# Patient Record
Sex: Male | Born: 1965
Health system: Southern US, Community
[De-identification: ages and names within clinical notes are randomized; demographics above are authoritative.]

## PROBLEM LIST (undated history)

## (undated) DIAGNOSIS — E785 Hyperlipidemia, unspecified: Secondary | ICD-10-CM

## (undated) DIAGNOSIS — E119 Type 2 diabetes mellitus without complications: Secondary | ICD-10-CM

## (undated) DIAGNOSIS — M3310 Other dermatopolymyositis, organ involvement unspecified: Secondary | ICD-10-CM

## (undated) DIAGNOSIS — I2510 Atherosclerotic heart disease of native coronary artery without angina pectoris: Secondary | ICD-10-CM

## (undated) DIAGNOSIS — T7840XA Allergy, unspecified, initial encounter: Secondary | ICD-10-CM

## (undated) HISTORY — PX: RHINOPLASTY: SUR1284

## (undated) HISTORY — DX: Allergy, unspecified, initial encounter: T78.40XA

## (undated) HISTORY — PX: KNEE ARTHROSCOPY: SUR90

## (undated) HISTORY — DX: Type 2 diabetes mellitus without complications: E11.9

## (undated) HISTORY — DX: Hyperlipidemia, unspecified: E78.5

---

## 2002-11-10 HISTORY — PX: LUMBAR DISC SURGERY: SHX700

## 2007-07-11 ENCOUNTER — Encounter: Admission: RE | Admit: 2007-07-11 | Discharge: 2007-07-11 | Payer: Self-pay | Admitting: Unknown Physician Specialty

## 2008-03-22 ENCOUNTER — Emergency Department: Payer: Self-pay | Admitting: Emergency Medicine

## 2010-02-06 ENCOUNTER — Encounter: Admission: RE | Admit: 2010-02-06 | Discharge: 2010-02-06 | Payer: Self-pay | Admitting: Internal Medicine

## 2013-07-15 DIAGNOSIS — M3310 Other dermatopolymyositis, organ involvement unspecified: Secondary | ICD-10-CM | POA: Insufficient documentation

## 2015-02-10 DIAGNOSIS — E668 Other obesity: Secondary | ICD-10-CM | POA: Insufficient documentation

## 2015-02-10 DIAGNOSIS — E1169 Type 2 diabetes mellitus with other specified complication: Secondary | ICD-10-CM | POA: Insufficient documentation

## 2015-02-10 DIAGNOSIS — M5137 Other intervertebral disc degeneration, lumbosacral region: Secondary | ICD-10-CM | POA: Insufficient documentation

## 2015-02-10 DIAGNOSIS — E781 Pure hyperglyceridemia: Secondary | ICD-10-CM | POA: Insufficient documentation

## 2015-02-10 DIAGNOSIS — Z1331 Encounter for screening for depression: Secondary | ICD-10-CM | POA: Insufficient documentation

## 2015-02-10 DIAGNOSIS — L240 Irritant contact dermatitis due to detergents: Secondary | ICD-10-CM | POA: Insufficient documentation

## 2015-02-10 DIAGNOSIS — R030 Elevated blood-pressure reading, without diagnosis of hypertension: Secondary | ICD-10-CM | POA: Insufficient documentation

## 2015-02-10 DIAGNOSIS — R079 Chest pain, unspecified: Secondary | ICD-10-CM | POA: Insufficient documentation

## 2015-02-10 DIAGNOSIS — Z713 Dietary counseling and surveillance: Secondary | ICD-10-CM | POA: Insufficient documentation

## 2015-02-10 DIAGNOSIS — E119 Type 2 diabetes mellitus without complications: Secondary | ICD-10-CM | POA: Insufficient documentation

## 2015-02-10 DIAGNOSIS — Z23 Encounter for immunization: Secondary | ICD-10-CM | POA: Insufficient documentation

## 2015-02-10 DIAGNOSIS — J069 Acute upper respiratory infection, unspecified: Secondary | ICD-10-CM

## 2015-02-10 DIAGNOSIS — K2100 Gastro-esophageal reflux disease with esophagitis, without bleeding: Secondary | ICD-10-CM | POA: Insufficient documentation

## 2015-02-10 DIAGNOSIS — J309 Allergic rhinitis, unspecified: Secondary | ICD-10-CM | POA: Insufficient documentation

## 2015-02-10 DIAGNOSIS — B9689 Other specified bacterial agents as the cause of diseases classified elsewhere: Secondary | ICD-10-CM | POA: Insufficient documentation

## 2015-02-10 DIAGNOSIS — Z8739 Personal history of other diseases of the musculoskeletal system and connective tissue: Secondary | ICD-10-CM | POA: Insufficient documentation

## 2015-02-10 DIAGNOSIS — E785 Hyperlipidemia, unspecified: Secondary | ICD-10-CM | POA: Insufficient documentation

## 2015-02-10 DIAGNOSIS — E669 Obesity, unspecified: Secondary | ICD-10-CM

## 2015-02-10 DIAGNOSIS — E782 Mixed hyperlipidemia: Secondary | ICD-10-CM | POA: Insufficient documentation

## 2015-04-18 ENCOUNTER — Encounter (INDEPENDENT_AMBULATORY_CARE_PROVIDER_SITE_OTHER): Payer: Self-pay

## 2015-04-18 ENCOUNTER — Ambulatory Visit (INDEPENDENT_AMBULATORY_CARE_PROVIDER_SITE_OTHER): Payer: 59 | Admitting: Family Medicine

## 2015-04-18 ENCOUNTER — Encounter: Payer: Self-pay | Admitting: Family Medicine

## 2015-04-18 VITALS — BP 132/78 | HR 87 | Temp 98.5°F | Resp 16 | Ht 71.0 in | Wt 308.0 lb

## 2015-04-18 DIAGNOSIS — E669 Obesity, unspecified: Secondary | ICD-10-CM | POA: Diagnosis not present

## 2015-04-18 DIAGNOSIS — E119 Type 2 diabetes mellitus without complications: Secondary | ICD-10-CM

## 2015-04-18 DIAGNOSIS — E785 Hyperlipidemia, unspecified: Secondary | ICD-10-CM

## 2015-04-18 DIAGNOSIS — R51 Headache: Secondary | ICD-10-CM

## 2015-04-18 DIAGNOSIS — R519 Headache, unspecified: Secondary | ICD-10-CM | POA: Insufficient documentation

## 2015-04-18 DIAGNOSIS — E1169 Type 2 diabetes mellitus with other specified complication: Secondary | ICD-10-CM

## 2015-04-18 MED ORDER — ROSUVASTATIN CALCIUM 20 MG PO TABS: 20.0000 mg | ORAL_TABLET | Freq: Once | ORAL | Status: AC

## 2015-04-18 MED ORDER — FENOFIBRATE 160 MG PO TABS: 160.0000 mg | ORAL_TABLET | Freq: Once | ORAL | Status: AC

## 2015-04-18 MED ORDER — GLIPIZIDE 5 MG PO TABS: 5.0000 mg | ORAL_TABLET | Freq: Two times a day (BID) | ORAL | Status: AC

## 2015-04-18 NOTE — Progress Notes (Signed)
Name: Jon Fowler   MRN: [id number]    DOB: 1965-11-18   Date:04/18/2015       Progress Note  Subjective  Chief Complaint  Chief Complaint  Patient presents with  . Follow-up    3 month for high cholesterol  . Headache    Onset 1 year    Headache  This is a recurrent (recently noticed that after IVIG treatments, he would have headaches) problem. The pain is located in the left unilateral and retro-orbital region. The pain does not radiate. The pain quality is similar to prior headaches. The quality of the pain is described as sharp. The pain is at a severity of 4/10. Associated symptoms include dizziness (occasional dizziness). Pertinent negatives include no blurred vision, ear pain, sore throat, visual change, weakness or weight loss. He has tried NSAIDs for the symptoms. The treatment provided significant relief. His past medical history is significant for obesity and sinus disease. There is no history of cluster headaches, hypertension or migraine headaches.  Diabetes He has type 2 diabetes mellitus. Hypoglycemia symptoms include dizziness (occasional dizziness) and headaches. Pertinent negatives for diabetes include no blurred vision, no chest pain, no fatigue, no polydipsia, no polyuria, no visual change, no weakness and no weight loss. Symptoms are improving. Pertinent negatives for diabetic complications include no CVA, heart disease, nephropathy or peripheral neuropathy. Risk factors for coronary artery disease include male sex, obesity and dyslipidemia. Current diabetic treatment includes oral agent (monotherapy). His weight is stable. He is following a diabetic and generally healthy diet. Frequency home blood tests: Pt. is not checking his Blood Glucose. An ACE inhibitor/angiotensin II receptor blocker is not being taken. He does not see a podiatrist.Eye exam is current.  Hyperlipidemia This is a chronic problem. The problem is controlled. Recent lipid tests were reviewed and are normal.  Exacerbating diseases include diabetes and obesity. He has no history of hypothyroidism. Pertinent negatives include no chest pain, focal sensory loss, leg pain, myalgias or shortness of breath. Current antihyperlipidemic treatment includes statins and fibric acid derivatives. The current treatment provides significant improvement of lipids. There are no compliance problems.  Risk factors for coronary artery disease include dyslipidemia, male sex and obesity.      Past Medical History  Diagnosis Date  . Hyperlipidemia   . Hypertension   . Diabetes mellitus without complication   . Allergy     History  Substance Use Topics  . Smoking status: Never Smoker   . Smokeless tobacco: Not on file  . Alcohol Use: No     Current outpatient prescriptions:  .  ALPRAZolam (XANAX) 0.5 MG tablet, Take 0.5 mg by mouth daily as needed., Disp: , Rfl:  .  aspirin EC 81 MG tablet, Take 81 mg by mouth daily., Disp: , Rfl:  .  Cholecalciferol (VITAMIN D) 2000 UNITS CAPS, Take 2,000 Units by mouth daily., Disp: , Rfl:  .  clobetasol ointment (TEMOVATE) 0.05 %, Apply 0.73 application topically 2 (two) times daily., Disp: , Rfl:  .  dapsone 100 MG tablet, Take 100 mg by mouth daily., Disp: , Rfl:  .  fenofibrate 160 MG tablet, Take 160 mg by mouth once., Disp: , Rfl:  .  glipiZIDE (GLUCOTROL) 5 MG tablet, Take 5 mg by mouth 2 (two) times daily., Disp: , Rfl:  .  hydroxychloroquine (PLAQUENIL) 200 MG tablet, Take 200 mg by mouth daily., Disp: , Rfl:  .  mycophenolate (CELLCEPT) 500 MG tablet, Take 500 mg by mouth 2 (two) times daily.  3 tablets, Disp: , Rfl:  .  rosuvastatin (CRESTOR) 20 MG tablet, Take 20 mg by mouth once. At Bedtime, Disp: , Rfl:   Allergies  Allergen Reactions  . Metformin     heartburn    Review of Systems  Constitutional: Negative for weight loss and fatigue.  HENT: Negative for congestion, ear pain and sore throat.   Eyes: Negative for blurred vision and double vision.   Respiratory: Negative for shortness of breath.   Cardiovascular: Negative for chest pain.  Musculoskeletal: Negative for myalgias.  Neurological: Positive for dizziness (occasional dizziness) and headaches. Negative for weakness.  Endo/Heme/Allergies: Negative for polydipsia.      Objective  Filed Vitals:   04/18/15 0828  BP: 132/78  Pulse: 87  Temp: 98.5 F (36.9 C)  TempSrc: Oral  Resp: 16  Height: 5\' 11"  (1.803 m)  Weight: 308 lb (139.708 kg)  SpO2: 93%     Physical Exam  Constitutional: He is well-developed, well-nourished, and in no distress.  HENT:  Head: Normocephalic and atraumatic.  Right Ear: Hearing and ear canal normal.  Left Ear: Hearing and ear canal normal.  Nose: Right sinus exhibits no maxillary sinus tenderness and no frontal sinus tenderness. Left sinus exhibits no maxillary sinus tenderness and no frontal sinus tenderness.  Eyes: Conjunctivae are normal. Pupils are equal, round, and reactive to light.  Neck: Normal range of motion. Neck supple.  Cardiovascular: Normal rate and regular rhythm.   Pulmonary/Chest: Effort normal and breath sounds normal.    No results found for this or any previous visit (from the past 2160 hour(s)).   Assessment & Plan 1. Dyslipidemia Lipid panel is at goal on fenofibrate and Crestor. We will repeat today. - fenofibrate 160 MG tablet; Take 1 tablet (160 mg total) by mouth once.  Dispense: 90 tablet; Refill: 1 - rosuvastatin (CRESTOR) 20 MG tablet; Take 1 tablet (20 mg total) by mouth once. At Bedtime  Dispense: 90 tablet; Refill: 2 - Lipid Profile  2. Diabetes mellitus type 2 in obe - glipiZIDE (GLUCOTROL) 5 MG tablet; Take 1 tablet (5 mg total) by mouth 2 (two) times daily before a meal.  Dispense: 180 tablet; Refill: 0 - HgB A1c  3. Headache above the eye region  Likely from the IVIG infusion. OTC NSAIDs usually relieve the headache. Patient was reassured    Toua Stites [facility] 04/18/2015 8:48 AM

## 2015-04-19 LAB — LIPID PANEL
CHOL/HDL RATIO: 2.8 ratio (ref 0.0–5.0)
Cholesterol, Total: 131 mg/dL (ref 100–199)
HDL: 46 mg/dL (ref >39)
LDL CALC: 61 mg/dL (ref 0–99)
TRIGLYCERIDES: 122 mg/dL (ref 0–149)
VLDL CHOLESTEROL CAL: 24 mg/dL (ref 5–40)

## 2015-04-19 LAB — HEMOGLOBIN A1C
ESTIMATED AVERAGE GLUCOSE: 97 mg/dL
HEMOGLOBIN A1C: 5 % (ref 4.8–5.6)

## 2015-07-19 ENCOUNTER — Ambulatory Visit: Payer: 59 | Admitting: Family Medicine

## 2015-10-23 ENCOUNTER — Other Ambulatory Visit: Payer: Self-pay | Admitting: Family Medicine

## 2016-04-28 ENCOUNTER — Other Ambulatory Visit: Payer: Self-pay | Admitting: Family Medicine

## 2016-12-05 DIAGNOSIS — M3310 Other dermatopolymyositis, organ involvement unspecified: Secondary | ICD-10-CM | POA: Diagnosis not present

## 2016-12-06 DIAGNOSIS — M3310 Other dermatopolymyositis, organ involvement unspecified: Secondary | ICD-10-CM | POA: Diagnosis not present

## 2017-01-02 DIAGNOSIS — M3310 Other dermatopolymyositis, organ involvement unspecified: Secondary | ICD-10-CM | POA: Diagnosis not present

## 2017-01-03 DIAGNOSIS — M3310 Other dermatopolymyositis, organ involvement unspecified: Secondary | ICD-10-CM | POA: Diagnosis not present

## 2017-01-04 ENCOUNTER — Emergency Department
Admission: EM | Admit: 2017-01-04 | Discharge: 2017-01-05 | Disposition: A | Discharge status: Home / Self Care | Payer: 59 | Attending: Emergency Medicine | Admitting: Emergency Medicine

## 2017-01-04 ENCOUNTER — Encounter: Payer: Self-pay | Admitting: Emergency Medicine

## 2017-01-04 DIAGNOSIS — K529 Noninfective gastroenteritis and colitis, unspecified: Secondary | ICD-10-CM | POA: Diagnosis not present

## 2017-01-04 DIAGNOSIS — A080 Rotaviral enteritis: Secondary | ICD-10-CM | POA: Diagnosis not present

## 2017-01-04 DIAGNOSIS — E119 Type 2 diabetes mellitus without complications: Secondary | ICD-10-CM | POA: Diagnosis not present

## 2017-01-04 DIAGNOSIS — I10 Essential (primary) hypertension: Secondary | ICD-10-CM | POA: Insufficient documentation

## 2017-01-04 DIAGNOSIS — R1084 Generalized abdominal pain: Secondary | ICD-10-CM | POA: Diagnosis not present

## 2017-01-04 DIAGNOSIS — I2510 Atherosclerotic heart disease of native coronary artery without angina pectoris: Secondary | ICD-10-CM | POA: Insufficient documentation

## 2017-01-04 DIAGNOSIS — R112 Nausea with vomiting, unspecified: Secondary | ICD-10-CM | POA: Diagnosis present

## 2017-01-04 DIAGNOSIS — Z7984 Long term (current) use of oral hypoglycemic drugs: Secondary | ICD-10-CM | POA: Insufficient documentation

## 2017-01-04 HISTORY — DX: Atherosclerotic heart disease of native coronary artery without angina pectoris: I25.10

## 2017-01-04 HISTORY — DX: Other dermatomyositis, organ involvement unspecified: M33.10

## 2017-01-04 LAB — LIPASE, BLOOD: LIPASE: 13 U/L (ref 11–51)

## 2017-01-04 LAB — COMPREHENSIVE METABOLIC PANEL
ALBUMIN: 2.8 g/dL — AB (ref 3.5–5.0)
ALT: 22 U/L (ref 17–63)
ANION GAP: 7 (ref 5–15)
AST: 24 U/L (ref 15–41)
Alkaline Phosphatase: 32 U/L — ABNORMAL LOW (ref 38–126)
BILIRUBIN TOTAL: 1 mg/dL (ref 0.3–1.2)
BUN: 20 mg/dL (ref 6–20)
CO2: 20 mmol/L — ABNORMAL LOW (ref 22–32)
Calcium: 7.9 mg/dL — ABNORMAL LOW (ref 8.9–10.3)
Chloride: 107 mmol/L (ref 101–111)
Creatinine, Ser: 1.01 mg/dL (ref 0.61–1.24)
GFR calc Af Amer: >60 mL/min (ref >60)
GFR calc non Af Amer: >60 mL/min (ref >60)
GLUCOSE: 145 mg/dL — AB (ref 65–99)
POTASSIUM: 3.4 mmol/L — AB (ref 3.5–5.1)
SODIUM: 134 mmol/L — AB (ref 135–145)
TOTAL PROTEIN: 8.9 g/dL — AB (ref 6.5–8.1)

## 2017-01-04 LAB — URINALYSIS, ROUTINE W REFLEX MICROSCOPIC
BILIRUBIN URINE: NEGATIVE
Glucose, UA: NEGATIVE mg/dL
KETONES UR: NEGATIVE mg/dL
LEUKOCYTES UA: NEGATIVE
Nitrite: NEGATIVE
Protein, ur: 30 mg/dL — AB
SQUAMOUS EPITHELIAL / LPF: NONE SEEN
Specific Gravity, Urine: 1.033 — ABNORMAL HIGH (ref 1.005–1.030)
pH: 5 (ref 5.0–8.0)

## 2017-01-04 LAB — CBC WITH DIFFERENTIAL/PLATELET
BASOS ABS: 0 10*3/uL (ref 0–0.1)
Basophils Relative: 0 %
Eosinophils Absolute: 0.1 10*3/uL (ref 0–0.7)
Eosinophils Relative: 2 %
HEMATOCRIT: 37.9 % — AB (ref 40.0–52.0)
HEMOGLOBIN: 13.3 g/dL (ref 13.0–18.0)
LYMPHS PCT: 4 %
Lymphs Abs: 0.2 10*3/uL — ABNORMAL LOW (ref 1.0–3.6)
MCH: 30.4 pg (ref 26.0–34.0)
MCHC: 35 g/dL (ref 32.0–36.0)
MCV: 86.7 fL (ref 80.0–100.0)
MONO ABS: 1 10*3/uL (ref 0.2–1.0)
Monocytes Relative: 16 %
NEUTROS ABS: 4.9 10*3/uL (ref 1.4–6.5)
NEUTROS PCT: 78 %
Platelets: 262 10*3/uL (ref 150–440)
RBC: 4.37 MIL/uL — AB (ref 4.40–5.90)
RDW: 18.6 % — ABNORMAL HIGH (ref 11.5–14.5)
WBC: 6.2 10*3/uL (ref 3.8–10.6)

## 2017-01-04 LAB — LACTIC ACID, PLASMA: Lactic Acid, Venous: 1.5 mmol/L (ref 0.5–1.9)

## 2017-01-04 MED ORDER — ONDANSETRON HCL 4 MG/2ML IJ SOLN
4.0000 mg | Freq: Once | INTRAMUSCULAR | Status: AC
  Administered 2017-01-04: 4 mg via INTRAVENOUS
  Filled 2017-01-04: qty 2

## 2017-01-04 MED ORDER — SODIUM CHLORIDE 0.9 % IV SOLN
1000.0000 mL | Freq: Once | INTRAVENOUS | Status: AC
  Administered 2017-01-04: 1000 mL via INTRAVENOUS

## 2017-01-04 NOTE — ED Triage Notes (Signed)
Pt presents to ED c/o constant vomiting and diarrhea

## 2017-01-04 NOTE — ED Provider Notes (Signed)
[facility] Emergency Department Provider Note  Time seen: 11:37 PM  I have reviewed the triage vital signs and the nursing notes.   HISTORY  Chief Complaint Emesis and Diarrhea    HPI Jon Fowler is a 51 y.o. male with a past medical history of dermatomyositis, diabetes, hypertension, hyperlipidemia, who presents to the emergency department with nausea and vomiting and diarrhea. According to the patient and record review the patient gets 2 rounds of IVIG every month for his dermatomyositis. He completed this 2/23 and 2/24 of this month. Since around 1:00 today he developed sudden onset of vomiting with diarrhea. States the vomiting lasted several hours and is now dissipated but he continues to have very frequent watery stools. States mild diffuse abdominal cramping denies any focal pain. Denies any fever. The wife called the on-call dermatologist thinking this could be related to the patient's IVIG administration. Was essentially told that they are not sure if it is related and that he should go to the emergency department for further evaluation. Patient is primarily seen at [facility] however states his symptoms were so significant he did not believe he can make it due to he came to [facility] for evaluation. Currently the patient overall appears well, lying in bed conversing normally but he does appear nauseated holding an emesis bag.  Past Medical History:  Diagnosis Date  . Allergy   . 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 Coronary artery disease   . Diabetes mellitus without complication ([place])   . Hyperlipidemia   . Hypertension     Patient Active Problem List   Diagnosis Date Noted  . Headache above the eye region 04/18/2015  . Elevated blood-pressure reading without diagnosis of hypertension 02/10/2015  . Chest pain 02/10/2015  . Degeneration of lumbar or lumbosacral intervertebral disc 02/10/2015  . Diabetes mellitus type 2 in obese ([place])  02/10/2015  . Dietary counseling and surveillance 02/10/2015  . Diabetes mellitus, type 2 ([place]) 02/10/2015  . Dyslipidemia 02/10/2015  . Elevated blood pressure, situational 02/10/2015  . Esophagitis, reflux 02/10/2015  . Elevated cholesterol with elevated triglycerides 02/10/2015  . Need for vaccination with Comvax 02/10/2015  . Bacterial upper respiratory infection 02/10/2015  . Allergic rhinitis 02/10/2015  . Screening for depression 02/10/2015  . Personal history of musculoskeletal disorder 02/10/2015  . Extreme obesity ([place]) 02/10/2015  . Contact dermatitis due to detergents 02/10/2015  . Hypertriglyceridemia 02/10/2015  . 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 KNEE ARTHROSCOPY Right    Dr. Jefm Bryant  . LUMBAR DISC SURGERY  2004   L4-L5  Dr. Pilar Jarvis  . RHINOPLASTY     Decoated Septum    Prior to Admission medications   Medication Sig Start Date End Date Taking? Authorizing Provider  ALPRAZolam Duanne Moron) 0.5 MG tablet Take 0.5 mg by mouth daily as needed. 02/09/13   Historical Provider, MD  aspirin EC 81 MG tablet Take 81 mg by mouth daily.    Historical Provider, MD  Cholecalciferol (VITAMIN D) 2000 UNITS CAPS Take 2,000 Units by mouth daily. 03/11/14   Historical Provider, MD  clobetasol ointment (TEMOVATE) 0.05 % Apply [id number] application topically 2 (two) times daily. 02/09/14   Historical Provider, MD  dapsone 100 MG tablet Take 100 mg by mouth daily.    Historical Provider, MD  fenofibrate 160 MG tablet Take 1 tablet (160 mg total) by mouth once. 04/18/15   Roselee Nova, MD  glipiZIDE (GLUCOTROL)  5 MG tablet Take 1 tablet (5 mg total) by mouth 2 (two) times daily before a meal. 04/18/15   Roselee Nova, MD  hydroxychloroquine (PLAQUENIL) 200 MG tablet Take 200 mg by mouth daily. 03/11/14   Historical Provider, MD  mycophenolate (CELLCEPT) 500 MG tablet Take 500 mg by mouth 2 (two) times daily. 3 tablets 03/11/14    Historical Provider, MD  rosuvastatin (CRESTOR) 20 MG tablet Take 1 tablet (20 mg total) by mouth once. At Bedtime 04/18/15   Roselee Nova, MD    Allergies  Allergen Reactions  . Metformin     heartburn    Family History  Problem Relation Age of Onset  . Diabetes Father   . Lymphoma Brother     Social History Social History  Substance Use Topics  . Smoking status: Never Smoker  . Smokeless tobacco: Never Used  . Alcohol use No    Review of Systems Constitutional: Negative for fever. Cardiovascular: Negative for chest pain. Respiratory: Negative for shortness of breath. Gastrointestinal: Abdominal cramping. Positive for nausea and vomiting. Positive for diarrhea. No black or bloody stool. Neurological: Negative for headache 10-point ROS otherwise negative.  ____________________________________________   PHYSICAL EXAM:  VITAL SIGNS: ED Triage Vitals  Enc Vitals Group     BP 01/04/17 2212 (!) 129/114     Pulse Rate 01/04/17 2212 64     Resp --      Temp 01/04/17 2212 97.9 F (36.6 C)     Temp Source 01/04/17 2212 Oral     SpO2 01/04/17 2212 98 %     Weight 01/04/17 2214 300 lb (136.1 kg)     Height 01/04/17 2214 5\' 10"  (1.778 m)     Head Circumference --      Peak Flow --      Pain Score 01/04/17 2214 8     Pain Loc --      Pain Edu? --      Excl. in [place]? --     Constitutional: Alert and oriented. Well appearing and in no distress. Eyes: Normal exam ENT   Head: Normocephalic and atraumatic.   Mouth/Throat: Mucous membranes are moist. Cardiovascular: Normal rate, regular rhythm. No murmur Respiratory: Normal respiratory effort without tachypnea nor retractions. Breath sounds are clear Gastrointestinal: Soft, no focal tenderness to palpation. No rebound or guarding. No distention. Musculoskeletal: Nontender with normal range of motion in all extremities.  Neurologic:  Normal speech and language. No gross focal neurologic deficits  Skin:  Skin is  warm, dry and intact.  Psychiatric: Mood and affect are normal.   ____________________________________________   INITIAL IMPRESSION / ASSESSMENT AND PLAN / ED COURSE  Pertinent labs & imaging results that were available during my care of the patient were reviewed by me and considered in my medical decision making (see chart for details).  The patient presents to the emergency department with sudden onset of nausea and vomiting and diarrhea. Patient completed IVIG infusion 2/23 and yesterday  2/24. Patient's basic labs are largely within normal limits. Patient feeling better with IV fluids and nausea medication. Patient's symptoms are very suggestive of viral gastroenteritis such as nor virus. We will obtain a stool sample for enteric testing as well as C. difficile testing. We will continue to closely monitor the patient in the emergency department. No other signs of significant reaction are present such as fever hives itching shortness of breath mucosal lesions, etc.  Labs are largely within normal limits. C. difficile is  negative. However the gastrointestinal panel is positive for rotavirus. Patient likely was exposed and due to his compromised immune system has significant symptoms from rotavirus. Patient has not had any further vomiting since arriving to the emergency department. States he continues to have diarrhea but it is slowing. Diarrhea has been ongoing for approximately 24 hours at this point. We will treat with loperamide and Zofran. Patient will follow up with his primary care doctor. Discussed supportive care at home. ____________________________________________   FINAL CLINICAL IMPRESSION(S) / ED DIAGNOSES  Nausea vomiting diarrhea Rotavirus   Harvest Dark, MD 01/05/17 [phone]

## 2017-01-04 NOTE — ED Notes (Signed)
Pt stating that he began with sx around 0130 this morning. Pt stating n/v/d. Pt stating that the vomiting has stopped but he is still experiencing diarrhea. Dr Kerman Passey is at pt's bedside for assessment. Pt denying any pain at this time and is stating that he is feeling better after receiving some IV fluids. Pt in NAD and VS WNL

## 2017-01-05 LAB — GASTROINTESTINAL PANEL BY PCR, STOOL (REPLACES STOOL CULTURE)
ASTROVIRUS: NOT DETECTED
Adenovirus F40/41: NOT DETECTED
CAMPYLOBACTER SPECIES: NOT DETECTED
Cryptosporidium: NOT DETECTED
Cyclospora cayetanensis: NOT DETECTED
ENTEROTOXIGENIC E COLI (ETEC): NOT DETECTED
Entamoeba histolytica: NOT DETECTED
Enteroaggregative E coli (EAEC): NOT DETECTED
Enteropathogenic E coli (EPEC): NOT DETECTED
Giardia lamblia: NOT DETECTED
NOROVIRUS GI/GII: NOT DETECTED
PLESIMONAS SHIGELLOIDES: NOT DETECTED
ROTAVIRUS A: DETECTED — AB
SAPOVIRUS (I, II, IV, AND V): NOT DETECTED
SHIGA LIKE TOXIN PRODUCING E COLI (STEC): NOT DETECTED
SHIGELLA/ENTEROINVASIVE E COLI (EIEC): NOT DETECTED
Salmonella species: NOT DETECTED
VIBRIO CHOLERAE: NOT DETECTED
Vibrio species: NOT DETECTED
Yersinia enterocolitica: NOT DETECTED

## 2017-01-05 LAB — C DIFFICILE QUICK SCREEN W PCR REFLEX
C DIFFICILE (CDIFF) INTERP: NOT DETECTED
C DIFFICILE (CDIFF) TOXIN: NEGATIVE
C Diff antigen: NEGATIVE

## 2017-01-05 MED ORDER — LOPERAMIDE HCL 2 MG PO CAPS
ORAL_CAPSULE | ORAL | Status: AC
  Administered 2017-01-05: 4 mg via ORAL
  Filled 2017-01-05: qty 2

## 2017-01-05 MED ORDER — ONDANSETRON HCL 4 MG/2ML IJ SOLN
4.0000 mg | Freq: Once | INTRAMUSCULAR | Status: AC
  Administered 2017-01-05: 4 mg via INTRAVENOUS

## 2017-01-05 MED ORDER — LOPERAMIDE HCL 2 MG PO CAPS
4.0000 mg | ORAL_CAPSULE | Freq: Once | ORAL | Status: AC
  Administered 2017-01-05: 4 mg via ORAL

## 2017-01-05 MED ORDER — ONDANSETRON 4 MG PO TBDP: 4.0000 mg | ORAL_TABLET | Freq: Three times a day (TID) | ORAL | 0 refills | Status: AC | PRN

## 2017-01-05 MED ORDER — ONDANSETRON HCL 4 MG/2ML IJ SOLN
INTRAMUSCULAR | Status: AC
  Administered 2017-01-05: 4 mg via INTRAVENOUS
  Filled 2017-01-05: qty 2

## 2017-01-05 MED ORDER — LOPERAMIDE HCL 2 MG PO TABS: 2.0000 mg | ORAL_TABLET | Freq: Four times a day (QID) | ORAL | 0 refills | Status: AC | PRN

## 2017-01-05 NOTE — ED Notes (Signed)
Pt given ice chips after okaying with Dr. Kerman Passey.

## 2017-01-06 LAB — URINE CULTURE: CULTURE: NO GROWTH

## 2017-01-30 DIAGNOSIS — R0781 Pleurodynia: Secondary | ICD-10-CM | POA: Diagnosis not present

## 2017-01-30 DIAGNOSIS — M3310 Other dermatopolymyositis, organ involvement unspecified: Secondary | ICD-10-CM | POA: Diagnosis not present

## 2017-01-30 DIAGNOSIS — R918 Other nonspecific abnormal finding of lung field: Secondary | ICD-10-CM | POA: Diagnosis not present

## 2017-01-30 DIAGNOSIS — Z79899 Other long term (current) drug therapy: Secondary | ICD-10-CM | POA: Diagnosis not present

## 2017-01-31 DIAGNOSIS — M3310 Other dermatopolymyositis, organ involvement unspecified: Secondary | ICD-10-CM | POA: Diagnosis not present

## 2017-02-13 DIAGNOSIS — G4733 Obstructive sleep apnea (adult) (pediatric): Secondary | ICD-10-CM | POA: Diagnosis not present

## 2017-02-13 DIAGNOSIS — E119 Type 2 diabetes mellitus without complications: Secondary | ICD-10-CM | POA: Diagnosis not present

## 2017-02-18 DIAGNOSIS — E119 Type 2 diabetes mellitus without complications: Secondary | ICD-10-CM | POA: Diagnosis not present

## 2017-02-18 DIAGNOSIS — M3310 Other dermatopolymyositis, organ involvement unspecified: Secondary | ICD-10-CM | POA: Diagnosis not present

## 2017-02-18 DIAGNOSIS — G4733 Obstructive sleep apnea (adult) (pediatric): Secondary | ICD-10-CM | POA: Diagnosis not present

## 2017-02-20 DIAGNOSIS — G4733 Obstructive sleep apnea (adult) (pediatric): Secondary | ICD-10-CM | POA: Diagnosis not present

## 2017-02-27 DIAGNOSIS — M3310 Other dermatopolymyositis, organ involvement unspecified: Secondary | ICD-10-CM | POA: Diagnosis not present

## 2017-02-28 DIAGNOSIS — M3310 Other dermatopolymyositis, organ involvement unspecified: Secondary | ICD-10-CM | POA: Diagnosis not present

## 2017-03-05 DIAGNOSIS — G4733 Obstructive sleep apnea (adult) (pediatric): Secondary | ICD-10-CM | POA: Diagnosis not present

## 2017-03-16 DIAGNOSIS — Z79899 Other long term (current) drug therapy: Secondary | ICD-10-CM | POA: Diagnosis not present

## 2017-04-03 DIAGNOSIS — M3310 Other dermatopolymyositis, organ involvement unspecified: Secondary | ICD-10-CM | POA: Diagnosis not present

## 2017-04-04 DIAGNOSIS — M3310 Other dermatopolymyositis, organ involvement unspecified: Secondary | ICD-10-CM | POA: Diagnosis not present

## 2017-04-10 ENCOUNTER — Ambulatory Visit
Admission: RE | Admit: 2017-04-10 | Discharge: 2017-04-10 | Disposition: A | Discharge status: Home / Self Care | Payer: 59 | Source: Ambulatory Visit | Attending: Internal Medicine | Admitting: Internal Medicine

## 2017-04-10 ENCOUNTER — Other Ambulatory Visit: Payer: Self-pay | Admitting: Internal Medicine

## 2017-04-10 DIAGNOSIS — M5126 Other intervertebral disc displacement, lumbar region: Secondary | ICD-10-CM | POA: Insufficient documentation

## 2017-04-10 DIAGNOSIS — M5136 Other intervertebral disc degeneration, lumbar region: Secondary | ICD-10-CM | POA: Diagnosis not present

## 2017-04-10 DIAGNOSIS — M545 Low back pain: Secondary | ICD-10-CM | POA: Diagnosis not present

## 2017-04-10 DIAGNOSIS — M48061 Spinal stenosis, lumbar region without neurogenic claudication: Secondary | ICD-10-CM | POA: Diagnosis not present

## 2017-04-10 DIAGNOSIS — E119 Type 2 diabetes mellitus without complications: Secondary | ICD-10-CM | POA: Diagnosis not present

## 2017-04-10 DIAGNOSIS — M3310 Other dermatopolymyositis, organ involvement unspecified: Secondary | ICD-10-CM | POA: Diagnosis not present

## 2017-04-30 DIAGNOSIS — M5416 Radiculopathy, lumbar region: Secondary | ICD-10-CM | POA: Diagnosis not present

## 2017-05-01 DIAGNOSIS — M3310 Other dermatopolymyositis, organ involvement unspecified: Secondary | ICD-10-CM | POA: Diagnosis not present

## 2017-05-02 DIAGNOSIS — M3310 Other dermatopolymyositis, organ involvement unspecified: Secondary | ICD-10-CM | POA: Diagnosis not present

## 2017-05-06 DIAGNOSIS — Z79899 Other long term (current) drug therapy: Secondary | ICD-10-CM | POA: Diagnosis not present

## 2017-05-06 DIAGNOSIS — M3310 Other dermatopolymyositis, organ involvement unspecified: Secondary | ICD-10-CM | POA: Diagnosis not present

## 2017-05-18 DIAGNOSIS — E119 Type 2 diabetes mellitus without complications: Secondary | ICD-10-CM | POA: Diagnosis not present

## 2017-05-18 DIAGNOSIS — M3310 Other dermatopolymyositis, organ involvement unspecified: Secondary | ICD-10-CM | POA: Diagnosis not present

## 2017-05-18 DIAGNOSIS — G4733 Obstructive sleep apnea (adult) (pediatric): Secondary | ICD-10-CM | POA: Diagnosis not present

## 2017-05-25 DIAGNOSIS — M3310 Other dermatopolymyositis, organ involvement unspecified: Secondary | ICD-10-CM | POA: Diagnosis not present

## 2017-05-25 DIAGNOSIS — E119 Type 2 diabetes mellitus without complications: Secondary | ICD-10-CM | POA: Diagnosis not present

## 2017-05-25 DIAGNOSIS — M5136 Other intervertebral disc degeneration, lumbar region: Secondary | ICD-10-CM | POA: Diagnosis not present

## 2017-05-29 DIAGNOSIS — M3310 Other dermatopolymyositis, organ involvement unspecified: Secondary | ICD-10-CM | POA: Diagnosis not present

## 2017-05-30 DIAGNOSIS — M3310 Other dermatopolymyositis, organ involvement unspecified: Secondary | ICD-10-CM | POA: Diagnosis not present

## 2017-07-06 DIAGNOSIS — M069 Rheumatoid arthritis, unspecified: Secondary | ICD-10-CM | POA: Diagnosis not present

## 2017-07-06 DIAGNOSIS — Z79899 Other long term (current) drug therapy: Secondary | ICD-10-CM | POA: Diagnosis not present

## 2017-07-10 DIAGNOSIS — M3310 Other dermatopolymyositis, organ involvement unspecified: Secondary | ICD-10-CM | POA: Diagnosis not present

## 2017-07-11 DIAGNOSIS — M3310 Other dermatopolymyositis, organ involvement unspecified: Secondary | ICD-10-CM | POA: Diagnosis not present

## 2017-08-07 DIAGNOSIS — M3310 Other dermatopolymyositis, organ involvement unspecified: Secondary | ICD-10-CM | POA: Diagnosis not present

## 2017-08-08 DIAGNOSIS — M3310 Other dermatopolymyositis, organ involvement unspecified: Secondary | ICD-10-CM | POA: Diagnosis not present

## 2017-08-12 DIAGNOSIS — Z79899 Other long term (current) drug therapy: Secondary | ICD-10-CM | POA: Diagnosis not present

## 2017-08-12 DIAGNOSIS — M3310 Other dermatopolymyositis, organ involvement unspecified: Secondary | ICD-10-CM | POA: Diagnosis not present

## 2017-08-19 DIAGNOSIS — G4733 Obstructive sleep apnea (adult) (pediatric): Secondary | ICD-10-CM | POA: Diagnosis not present

## 2017-09-01 DIAGNOSIS — G4733 Obstructive sleep apnea (adult) (pediatric): Secondary | ICD-10-CM | POA: Diagnosis not present

## 2017-09-04 DIAGNOSIS — M3313 Other dermatomyositis without myopathy: Secondary | ICD-10-CM | POA: Diagnosis not present

## 2017-09-05 DIAGNOSIS — M3310 Other dermatopolymyositis, organ involvement unspecified: Secondary | ICD-10-CM | POA: Diagnosis not present

## 2017-09-09 DIAGNOSIS — E119 Type 2 diabetes mellitus without complications: Secondary | ICD-10-CM | POA: Diagnosis not present

## 2017-09-09 DIAGNOSIS — R972 Elevated prostate specific antigen [PSA]: Secondary | ICD-10-CM | POA: Diagnosis not present

## 2017-09-16 DIAGNOSIS — Z0000 Encounter for general adult medical examination without abnormal findings: Secondary | ICD-10-CM | POA: Diagnosis not present

## 2017-09-16 DIAGNOSIS — E119 Type 2 diabetes mellitus without complications: Secondary | ICD-10-CM | POA: Diagnosis not present

## 2017-09-16 DIAGNOSIS — M3310 Other dermatopolymyositis, organ involvement unspecified: Secondary | ICD-10-CM | POA: Diagnosis not present

## 2017-10-03 DIAGNOSIS — M3310 Other dermatopolymyositis, organ involvement unspecified: Secondary | ICD-10-CM | POA: Diagnosis not present

## 2017-10-05 DIAGNOSIS — M3310 Other dermatopolymyositis, organ involvement unspecified: Secondary | ICD-10-CM | POA: Diagnosis not present

## 2017-10-21 DIAGNOSIS — Z1211 Encounter for screening for malignant neoplasm of colon: Secondary | ICD-10-CM | POA: Diagnosis not present

## 2017-10-30 DIAGNOSIS — M3310 Other dermatopolymyositis, organ involvement unspecified: Secondary | ICD-10-CM | POA: Diagnosis not present

## 2017-10-31 DIAGNOSIS — M3310 Other dermatopolymyositis, organ involvement unspecified: Secondary | ICD-10-CM | POA: Diagnosis not present

## 2017-11-19 DIAGNOSIS — Z79899 Other long term (current) drug therapy: Secondary | ICD-10-CM | POA: Diagnosis not present

## 2017-11-27 DIAGNOSIS — M3313 Other dermatomyositis without myopathy: Secondary | ICD-10-CM | POA: Diagnosis not present

## 2017-11-28 DIAGNOSIS — M3393 Dermatopolymyositis, unspecified without myopathy: Secondary | ICD-10-CM | POA: Diagnosis not present

## 2017-12-02 DIAGNOSIS — M3310 Other dermatopolymyositis, organ involvement unspecified: Secondary | ICD-10-CM | POA: Diagnosis not present

## 2017-12-02 DIAGNOSIS — Z79899 Other long term (current) drug therapy: Secondary | ICD-10-CM | POA: Diagnosis not present

## 2017-12-02 DIAGNOSIS — R229 Localized swelling, mass and lump, unspecified: Secondary | ICD-10-CM | POA: Diagnosis not present

## 2017-12-02 DIAGNOSIS — R918 Other nonspecific abnormal finding of lung field: Secondary | ICD-10-CM | POA: Diagnosis not present

## 2017-12-10 DIAGNOSIS — R1900 Intra-abdominal and pelvic swelling, mass and lump, unspecified site: Secondary | ICD-10-CM | POA: Diagnosis not present

## 2017-12-10 DIAGNOSIS — G4733 Obstructive sleep apnea (adult) (pediatric): Secondary | ICD-10-CM | POA: Diagnosis not present

## 2017-12-10 DIAGNOSIS — Z79899 Other long term (current) drug therapy: Secondary | ICD-10-CM | POA: Diagnosis not present

## 2017-12-10 DIAGNOSIS — M5136 Other intervertebral disc degeneration, lumbar region: Secondary | ICD-10-CM | POA: Diagnosis not present

## 2017-12-10 DIAGNOSIS — E119 Type 2 diabetes mellitus without complications: Secondary | ICD-10-CM | POA: Diagnosis not present

## 2017-12-10 DIAGNOSIS — E7849 Other hyperlipidemia: Secondary | ICD-10-CM | POA: Diagnosis not present

## 2017-12-10 DIAGNOSIS — M3310 Other dermatopolymyositis, organ involvement unspecified: Secondary | ICD-10-CM | POA: Diagnosis not present

## 2017-12-17 DIAGNOSIS — M5136 Other intervertebral disc degeneration, lumbar region: Secondary | ICD-10-CM | POA: Diagnosis not present

## 2017-12-17 DIAGNOSIS — M3310 Other dermatopolymyositis, organ involvement unspecified: Secondary | ICD-10-CM | POA: Diagnosis not present

## 2017-12-17 DIAGNOSIS — E119 Type 2 diabetes mellitus without complications: Secondary | ICD-10-CM | POA: Diagnosis not present

## 2018-01-08 ENCOUNTER — Encounter: Payer: Self-pay | Admitting: *Deleted

## 2018-01-11 ENCOUNTER — Ambulatory Visit
Admission: RE | Admit: 2018-01-11 | Discharge: 2018-01-11 | Disposition: A | Discharge status: Home / Self Care | Payer: 59 | Source: Ambulatory Visit | Attending: Unknown Physician Specialty | Admitting: Unknown Physician Specialty

## 2018-01-11 ENCOUNTER — Ambulatory Visit: Payer: 59 | Admitting: Anesthesiology

## 2018-01-11 ENCOUNTER — Encounter
Admission: RE | Disposition: A | Discharge status: Home / Self Care | Payer: Self-pay | Source: Ambulatory Visit | Attending: Unknown Physician Specialty

## 2018-01-11 ENCOUNTER — Encounter: Payer: Self-pay | Admitting: *Deleted

## 2018-01-11 ENCOUNTER — Other Ambulatory Visit: Payer: Self-pay

## 2018-01-11 DIAGNOSIS — D120 Benign neoplasm of cecum: Secondary | ICD-10-CM | POA: Diagnosis not present

## 2018-01-11 DIAGNOSIS — D123 Benign neoplasm of transverse colon: Secondary | ICD-10-CM | POA: Insufficient documentation

## 2018-01-11 DIAGNOSIS — K5730 Diverticulosis of large intestine without perforation or abscess without bleeding: Secondary | ICD-10-CM | POA: Diagnosis not present

## 2018-01-11 DIAGNOSIS — Z7984 Long term (current) use of oral hypoglycemic drugs: Secondary | ICD-10-CM | POA: Insufficient documentation

## 2018-01-11 DIAGNOSIS — M3313 Other dermatomyositis without myopathy: Secondary | ICD-10-CM | POA: Diagnosis not present

## 2018-01-11 DIAGNOSIS — I2510 Atherosclerotic heart disease of native coronary artery without angina pectoris: Secondary | ICD-10-CM | POA: Insufficient documentation

## 2018-01-11 DIAGNOSIS — Z1211 Encounter for screening for malignant neoplasm of colon: Secondary | ICD-10-CM | POA: Diagnosis not present

## 2018-01-11 DIAGNOSIS — D122 Benign neoplasm of ascending colon: Secondary | ICD-10-CM | POA: Insufficient documentation

## 2018-01-11 DIAGNOSIS — Z6841 Body Mass Index (BMI) 40.0 and over, adult: Secondary | ICD-10-CM | POA: Diagnosis not present

## 2018-01-11 DIAGNOSIS — E119 Type 2 diabetes mellitus without complications: Secondary | ICD-10-CM | POA: Diagnosis not present

## 2018-01-11 DIAGNOSIS — Z7982 Long term (current) use of aspirin: Secondary | ICD-10-CM | POA: Diagnosis not present

## 2018-01-11 DIAGNOSIS — K640 First degree hemorrhoids: Secondary | ICD-10-CM | POA: Diagnosis not present

## 2018-01-11 DIAGNOSIS — Z79899 Other long term (current) drug therapy: Secondary | ICD-10-CM | POA: Insufficient documentation

## 2018-01-11 DIAGNOSIS — K635 Polyp of colon: Secondary | ICD-10-CM | POA: Diagnosis not present

## 2018-01-11 DIAGNOSIS — E785 Hyperlipidemia, unspecified: Secondary | ICD-10-CM | POA: Diagnosis not present

## 2018-01-11 DIAGNOSIS — K5790 Diverticulosis of intestine, part unspecified, without perforation or abscess without bleeding: Secondary | ICD-10-CM | POA: Diagnosis not present

## 2018-01-11 DIAGNOSIS — I10 Essential (primary) hypertension: Secondary | ICD-10-CM | POA: Diagnosis not present

## 2018-01-11 HISTORY — PX: COLONOSCOPY WITH PROPOFOL: SHX5780

## 2018-01-11 SURGERY — COLONOSCOPY WITH PROPOFOL
Anesthesia: General

## 2018-01-11 MED ORDER — PROPOFOL 10 MG/ML IV BOLUS
INTRAVENOUS | Status: DC | PRN
  Administered 2018-01-11: 90 mg via INTRAVENOUS

## 2018-01-11 MED ORDER — PROPOFOL 500 MG/50ML IV EMUL
INTRAVENOUS | Status: DC | PRN
  Administered 2018-01-11: 125 ug/kg/min via INTRAVENOUS

## 2018-01-11 MED ORDER — PROPOFOL 500 MG/50ML IV EMUL
INTRAVENOUS | Status: AC
  Filled 2018-01-11: qty 50

## 2018-01-11 MED ORDER — LIDOCAINE HCL (CARDIAC) 20 MG/ML IV SOLN
INTRAVENOUS | Status: DC | PRN
  Administered 2018-01-11: 30 mg via INTRAVENOUS

## 2018-01-11 MED ORDER — LIDOCAINE HCL (PF) 2 % IJ SOLN
INTRAMUSCULAR | Status: AC
  Filled 2018-01-11: qty 10

## 2018-01-11 MED ORDER — SODIUM CHLORIDE 0.9 % IV SOLN: INTRAVENOUS | Status: DC

## 2018-01-11 MED ORDER — SODIUM CHLORIDE 0.9 % IV SOLN
INTRAVENOUS | Status: DC
  Administered 2018-01-11: 08:00:00 via INTRAVENOUS

## 2018-01-11 NOTE — Anesthesia Postprocedure Evaluation (Signed)
Anesthesia Post Note  Patient: Jon Fowler  Procedure(s) Performed: COLONOSCOPY WITH PROPOFOL (N/A )  Patient location during evaluation: Endoscopy Anesthesia Type: General Level of consciousness: awake and alert Pain management: pain level controlled Vital Signs Assessment: post-procedure vital signs reviewed and stable Respiratory status: spontaneous breathing, nonlabored ventilation, respiratory function stable and patient connected to nasal cannula oxygen Cardiovascular status: blood pressure returned to baseline and stable Postop Assessment: no apparent nausea or vomiting Anesthetic complications: no     Last Vitals:  Vitals:   01/11/18 0926 01/11/18 0936  BP: 139/90 (!) 145/87  Pulse: 85 80  Resp: 14 18  Temp:    SpO2: 99% 97%    Last Pain:  Vitals:   01/11/18 0906  TempSrc: Axillary                 Martha Clan

## 2018-01-11 NOTE — Op Note (Signed)
[facility] Gastroenterology Patient Name: Jon Fowler Procedure Date: 01/11/2018 8:35 AM MRN: [id number] Account #: [id number] Date of Birth: 10/30/1966 Admit Type: Outpatient Age: 52 Room: [facility] ENDO ROOM 3 Gender: Male Note Status: Finalized Procedure:            Colonoscopy Indications:          Screening for colorectal malignant neoplasm Providers:            Manya Silvas, MD Medicines:            Propofol per Anesthesia Complications:        No immediate complications. Procedure:            Pre-Anesthesia Assessment:                       - After reviewing the risks and benefits, the patient                        was deemed in satisfactory condition to undergo the                        procedure.                       After obtaining informed consent, the colonoscope was                        passed under direct vision. Throughout the procedure,                        the patient's blood pressure, pulse, and oxygen                        saturations were monitored continuously. The                        Colonoscope was introduced through the anus and                        advanced to the the cecum, identified by appendiceal                        orifice and ileocecal valve. The colonoscopy was                        performed without difficulty. The patient tolerated the                        procedure well. The quality of the bowel preparation                        was excellent. Findings:      Three sessile polyps were found in the transverse colon, ascending colon       and cecum. The polyps were diminutive in size. These polyps were removed       with a jumbo cold forceps. Resection and retrieval were complete.      A few small-mouthed diverticula were found in the descending colon.      Internal hemorrhoids were found during endoscopy. The hemorrhoids were       small and Grade I (internal hemorrhoids that do not prolapse).  The exam was  otherwise without abnormality. Impression:           - Three diminutive polyps in the transverse colon, in                        the ascending colon and in the cecum, removed with a                        jumbo cold forceps. Resected and retrieved.                       - Diverticulosis in the descending colon.                       - Internal hemorrhoids.                       - The examination was otherwise normal. Recommendation:       - Await pathology results. Manya Silvas, MD 01/11/2018 9:06:11 AM This report has been signed electronically. Number of Addenda: 0 Note Initiated On: 01/11/2018 8:35 AM Scope Withdrawal Time: 0 hours 13 minutes 24 seconds  Total Procedure Duration: 0 hours 17 minutes 17 seconds       [facility]

## 2018-01-11 NOTE — Anesthesia Preprocedure Evaluation (Signed)
Anesthesia Evaluation  Patient identified by MRN, date of birth, ID band Patient awake    Reviewed: Allergy & Precautions, H&P , NPO status , Patient's Chart, lab work & pertinent test results, reviewed documented beta blocker date and time   History of Anesthesia Complications Negative for: history of anesthetic complications  Airway Mallampati: II  TM Distance: >3 FB Neck ROM: full    Dental  (+) Caps, Dental Advidsory Given, Missing, Teeth Intact   Pulmonary neg pulmonary ROS,           Cardiovascular Exercise Tolerance: Good hypertension, (-) angina(-) CAD, (-) Past MI, (-) Cardiac Stents and (-) CABG (-) dysrhythmias (-) Valvular Problems/Murmurs     Neuro/Psych neg Seizures  Neuromuscular disease negative psych ROS   GI/Hepatic negative GI ROS, Neg liver ROS,   Endo/Other  diabetes, Oral Hypoglycemic AgentsMorbid obesity  Renal/GU negative Renal ROS  negative genitourinary   Musculoskeletal   Abdominal   Peds  Hematology negative hematology ROS (+)   Anesthesia Other Findings Past Medical History: No date: Allergy No date: 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[place]) No date: Coronary artery disease No date: Diabetes mellitus without complication ([facility]) No date: Hyperlipidemia   Reproductive/Obstetrics negative OB ROS                             Anesthesia Physical Anesthesia Plan  ASA: III  Anesthesia Plan: General   Post-op Pain Management:    Induction: Intravenous  PONV Risk Score and Plan: 2 and Propofol infusion  Airway Management Planned: Nasal Cannula  Additional Equipment:   Intra-op Plan:   Post-operative Plan:   Informed Consent: I have reviewed the patients History and Physical, chart, labs and discussed the procedure including the risks, benefits and alternatives for the proposed anesthesia with the patient or authorized representative who has indicated his/her  understanding and acceptance.   Dental Advisory Given  Plan Discussed with: Anesthesiologist, CRNA and Surgeon  Anesthesia Plan Comments:         Anesthesia Quick Evaluation

## 2018-01-11 NOTE — Anesthesia Post-op Follow-up Note (Signed)
Anesthesia QCDR form completed.        

## 2018-01-11 NOTE — H&P (Signed)
Primary Care Physician:  Adin Hector, MD Primary Gastroenterologist:  Dr. Vira Agar  Pre-Procedure History & Physical: HPI:  Jon Fowler is a 52 y.o. male is here for an colonoscopy.  This is for screening for colon cancer.   Past Medical History:  Diagnosis Date  . Allergy   . 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 Coronary artery disease   . Diabetes mellitus without complication ([place])   . Hyperlipidemia     Past Surgical History:  Procedure Laterality Date  . KNEE ARTHROSCOPY Right    Dr. Jefm Bryant  . LUMBAR DISC SURGERY  2004   L4-L5  Dr. Pilar Jarvis  . RHINOPLASTY     Decoated Septum    Prior to Admission medications   Medication Sig Start Date End Date Taking? Authorizing Provider  aspirin EC 81 MG tablet Take 81 mg by mouth daily.   Yes [provider]  fenofibrate 160 MG tablet Take 1 tablet (160 mg total) by mouth once. 04/18/15  Yes Roselee Nova, MD  glipiZIDE (GLUCOTROL) 5 MG tablet Take 1 tablet (5 mg total) by mouth 2 (two) times daily before a meal. 04/18/15  Yes Roselee Nova, MD  hydroxychloroquine (PLAQUENIL) 200 MG tablet Take 200 mg by mouth daily. 03/11/14  Yes [provider]  rosuvastatin (CRESTOR) 20 MG tablet Take 1 tablet (20 mg total) by mouth once. At Bedtime 04/18/15  Yes Roselee Nova, MD  ALPRAZolam Duanne Moron) 0.5 MG tablet Take 0.5 mg by mouth daily as needed. 02/09/13   [provider]  Cholecalciferol (VITAMIN D) 2000 UNITS CAPS Take 2,000 Units by mouth daily. 03/11/14   [provider]  clobetasol ointment (TEMOVATE) 0.05 % Apply 5.64 application topically 2 (two) times daily. 02/09/14   [provider]  dapsone 100 MG tablet Take 100 mg by mouth daily.    [provider]  loperamide (IMODIUM A-D) 2 MG tablet Take 1 tablet (2 mg total) by mouth 4 (four) times daily as needed for diarrhea or loose stools. Patient not taking: Reported on 01/11/2018 01/05/17   Harvest Dark, MD  mycophenolate  (CELLCEPT) 500 MG tablet Take 500 mg by mouth 2 (two) times daily. 3 tablets 03/11/14   [provider]  ondansetron (ZOFRAN ODT) 4 MG disintegrating tablet Take 1 tablet (4 mg total) by mouth every 8 (eight) hours as needed for nausea or vomiting. Patient not taking: Reported on 01/11/2018 01/05/17   Harvest Dark, MD    Allergies as of 10/23/2017 - Review Complete 04/18/2015  Allergen Reaction Noted  . Metformin  02/10/2015    Family History  Problem Relation Age of Onset  . Diabetes Father   . Lymphoma Brother     Social History   Socioeconomic History  . Marital status: Married    Spouse name: Not on file  . Number of children: Not on file  . Years of education: Not on file  . Highest education level: Not on file  Social Needs  . Financial resource strain: Not on file  . Food insecurity - worry: Not on file  . Food insecurity - inability: Not on file  . Transportation needs - medical: Not on file  . Transportation needs - non-medical: Not on file  Occupational History  . Occupation: Surveyor  Tobacco Use  . Smoking status: Never Smoker  . Smokeless tobacco: Never Used  Substance and Sexual Activity  . Alcohol use: No  . Drug use: No  .  Sexual activity: Yes  Other Topics Concern  . Not on file  Social History Narrative  . Not on file    Review of Systems: See HPI, otherwise negative ROS  Physical Exam: BP 138/86   Pulse 94   Temp (!) 97.5 F (36.4 C) (Tympanic)   Resp 16   Ht 5\' 10"  (1.778 m)   Wt 131.5 kg (290 lb)   SpO2 95%   BMI 41.61 kg/m  General:   Alert,  pleasant and cooperative in NAD Head:  Normocephalic and atraumatic. Neck:  Supple; no masses or thyromegaly. Lungs:  Clear throughout to auscultation.    Heart:  Regular rate and rhythm. Abdomen:  Soft, nontender and nondistended. Normal bowel sounds, without guarding, and without rebound.   Neurologic:  Alert and  oriented x4;  grossly normal  neurologically.  Impression/Plan: Jon Brine Lozinski is here for an colonoscopy to be performed for colon cancer screening.  Risks, benefits, limitations, and alternatives regarding  colonoscopy have been reviewed with the patient.  Questions have been answered.  All parties agreeable.   Gaylyn Cheers, MD  01/11/2018, 8:36 AM   Primary Care Physician:  Adin Hector, MD Primary Gastroenterologist:  Dr. Vira Agar  Pre-Procedure History & Physical: HPI:  Jon Fowler is a 52 y.o. male is here for an colonoscopy.   Past Medical History:  Diagnosis Date  . Allergy   . 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 Coronary artery disease   . Diabetes mellitus without complication ([place])   . Hyperlipidemia     Past Surgical History:  Procedure Laterality Date  . KNEE ARTHROSCOPY Right    Dr. Jefm Bryant  . LUMBAR DISC SURGERY  2004   L4-L5  Dr. Pilar Jarvis  . RHINOPLASTY     Decoated Septum    Prior to Admission medications   Medication Sig Start Date End Date Taking? Authorizing Provider  aspirin EC 81 MG tablet Take 81 mg by mouth daily.   Yes [provider]  fenofibrate 160 MG tablet Take 1 tablet (160 mg total) by mouth once. 04/18/15  Yes Roselee Nova, MD  glipiZIDE (GLUCOTROL) 5 MG tablet Take 1 tablet (5 mg total) by mouth 2 (two) times daily before a meal. 04/18/15  Yes Roselee Nova, MD  hydroxychloroquine (PLAQUENIL) 200 MG tablet Take 200 mg by mouth daily. 03/11/14  Yes [provider]  rosuvastatin (CRESTOR) 20 MG tablet Take 1 tablet (20 mg total) by mouth once. At Bedtime 04/18/15  Yes Roselee Nova, MD  ALPRAZolam Duanne Moron) 0.5 MG tablet Take 0.5 mg by mouth daily as needed. 02/09/13   [provider]  Cholecalciferol (VITAMIN D) 2000 UNITS CAPS Take 2,000 Units by mouth daily. 03/11/14   [provider]  clobetasol ointment (TEMOVATE) 0.05 % Apply 3.01 application topically 2 (two) times daily. 02/09/14   [provider]  dapsone 100 MG  tablet Take 100 mg by mouth daily.    [provider]  loperamide (IMODIUM A-D) 2 MG tablet Take 1 tablet (2 mg total) by mouth 4 (four) times daily as needed for diarrhea or loose stools. Patient not taking: Reported on 01/11/2018 01/05/17   Harvest Dark, MD  mycophenolate (CELLCEPT) 500 MG tablet Take 500 mg by mouth 2 (two) times daily. 3 tablets 03/11/14   [provider]  ondansetron (ZOFRAN ODT) 4 MG disintegrating tablet Take 1 tablet (4 mg total) by mouth every 8 (eight) hours as needed for nausea or  vomiting. Patient not taking: Reported on 01/11/2018 01/05/17   Harvest Dark, MD    Allergies as of 10/23/2017 - Review Complete 04/18/2015  Allergen Reaction Noted  . Metformin  02/10/2015    Family History  Problem Relation Age of Onset  . Diabetes Father   . Lymphoma Brother     Social History   Socioeconomic History  . Marital status: Married    Spouse name: Not on file  . Number of children: Not on file  . Years of education: Not on file  . Highest education level: Not on file  Social Needs  . Financial resource strain: Not on file  . Food insecurity - worry: Not on file  . Food insecurity - inability: Not on file  . Transportation needs - medical: Not on file  . Transportation needs - non-medical: Not on file  Occupational History  . Occupation: Surveyor  Tobacco Use  . Smoking status: Never Smoker  . Smokeless tobacco: Never Used  Substance and Sexual Activity  . Alcohol use: No  . Drug use: No  . Sexual activity: Yes  Other Topics Concern  . Not on file  Social History Narrative  . Not on file    Review of Systems: See HPI, otherwise negative ROS  Physical Exam: BP 138/86   Pulse 94   Temp (!) 97.5 F (36.4 C) (Tympanic)   Resp 16   Ht 5\' 10"  (1.778 m)   Wt 131.5 kg (290 lb)   SpO2 95%   BMI 41.61 kg/m  General:   Alert,  pleasant and cooperative in NAD Head:  Normocephalic and atraumatic. Neck:  Supple; no masses or  thyromegaly. Lungs:  Clear throughout to auscultation.    Heart:  Regular rate and rhythm. Abdomen:  Soft, nontender and nondistended. Normal bowel sounds, without guarding, and without rebound.   Neurologic:  Alert and  oriented x4;  grossly normal neurologically.  Impression/Plan: Jon Fowler is here for an colonoscopy to be performed for screening for colon cancer  Risks, benefits, limitations, and alternatives regarding  colonoscopy have been reviewed with the patient.  Questions have been answered.  All parties agreeable.   Gaylyn Cheers, MD  01/11/2018, 8:36 AM

## 2018-01-11 NOTE — Transfer of Care (Signed)
Immediate Anesthesia Transfer of Care Note  Patient: Jon Fowler  Procedure(s) Performed: COLONOSCOPY WITH PROPOFOL (N/A )  Patient Location: PACU and Endoscopy Unit  Anesthesia Type:General  Level of Consciousness: awake  Airway & Oxygen Therapy: Patient Spontanous Breathing  Post-op Assessment: Report given to RN  Post vital signs: stable  Last Vitals:  Vitals:   01/11/18 0806  BP: 138/86  Pulse: 94  Resp: 16  Temp: (!) 36.4 C  SpO2: 95%    Last Pain:  Vitals:   01/11/18 0806  TempSrc: Tympanic      Patients Stated Pain Goal: 8 (08/02/29 0762)  Complications: No apparent anesthesia complications

## 2018-01-12 ENCOUNTER — Encounter: Payer: Self-pay | Admitting: Unknown Physician Specialty

## 2018-01-12 LAB — SURGICAL PATHOLOGY

## 2018-02-03 DIAGNOSIS — R499 Unspecified voice and resonance disorder: Secondary | ICD-10-CM | POA: Diagnosis not present

## 2018-02-03 DIAGNOSIS — M3390 Dermatopolymyositis, unspecified, organ involvement unspecified: Secondary | ICD-10-CM | POA: Diagnosis not present

## 2018-02-03 DIAGNOSIS — R490 Dysphonia: Secondary | ICD-10-CM | POA: Diagnosis not present

## 2018-02-03 DIAGNOSIS — Z79899 Other long term (current) drug therapy: Secondary | ICD-10-CM | POA: Diagnosis not present

## 2018-02-03 DIAGNOSIS — R1310 Dysphagia, unspecified: Secondary | ICD-10-CM | POA: Diagnosis not present

## 2018-02-12 DIAGNOSIS — M3310 Other dermatopolymyositis, organ involvement unspecified: Secondary | ICD-10-CM | POA: Diagnosis not present

## 2018-02-15 DIAGNOSIS — M3390 Dermatopolymyositis, unspecified, organ involvement unspecified: Secondary | ICD-10-CM | POA: Diagnosis not present

## 2018-02-15 DIAGNOSIS — R1310 Dysphagia, unspecified: Secondary | ICD-10-CM | POA: Diagnosis not present

## 2018-02-24 ENCOUNTER — Other Ambulatory Visit: Payer: Self-pay | Admitting: Nurse Practitioner

## 2018-02-24 ENCOUNTER — Ambulatory Visit
Admission: RE | Admit: 2018-02-24 | Discharge: 2018-02-24 | Disposition: A | Discharge status: Home / Self Care | Payer: 59 | Source: Ambulatory Visit | Attending: Nurse Practitioner | Admitting: Nurse Practitioner

## 2018-02-24 DIAGNOSIS — K219 Gastro-esophageal reflux disease without esophagitis: Secondary | ICD-10-CM | POA: Insufficient documentation

## 2018-02-24 DIAGNOSIS — R1310 Dysphagia, unspecified: Secondary | ICD-10-CM | POA: Diagnosis present

## 2018-02-24 DIAGNOSIS — M3390 Dermatopolymyositis, unspecified, organ involvement unspecified: Secondary | ICD-10-CM | POA: Insufficient documentation

## 2018-02-24 DIAGNOSIS — R5383 Other fatigue: Secondary | ICD-10-CM | POA: Diagnosis not present

## 2018-02-24 DIAGNOSIS — M6089 Other myositis, multiple sites: Secondary | ICD-10-CM | POA: Diagnosis not present

## 2018-02-24 DIAGNOSIS — R1319 Other dysphagia: Secondary | ICD-10-CM | POA: Diagnosis not present

## 2018-02-25 DIAGNOSIS — M3310 Other dermatopolymyositis, organ involvement unspecified: Secondary | ICD-10-CM | POA: Diagnosis not present

## 2018-02-26 DIAGNOSIS — M3310 Other dermatopolymyositis, organ involvement unspecified: Secondary | ICD-10-CM | POA: Diagnosis not present

## 2018-03-04 DIAGNOSIS — G4733 Obstructive sleep apnea (adult) (pediatric): Secondary | ICD-10-CM | POA: Diagnosis not present

## 2018-03-04 DIAGNOSIS — K2970 Gastritis, unspecified, without bleeding: Secondary | ICD-10-CM | POA: Diagnosis not present

## 2018-03-04 DIAGNOSIS — R1310 Dysphagia, unspecified: Secondary | ICD-10-CM | POA: Diagnosis not present

## 2018-03-04 DIAGNOSIS — R918 Other nonspecific abnormal finding of lung field: Secondary | ICD-10-CM | POA: Diagnosis not present

## 2018-03-05 DIAGNOSIS — M6089 Other myositis, multiple sites: Secondary | ICD-10-CM | POA: Diagnosis not present

## 2018-03-05 DIAGNOSIS — R1313 Dysphagia, pharyngeal phase: Secondary | ICD-10-CM | POA: Diagnosis not present

## 2018-03-05 DIAGNOSIS — R5383 Other fatigue: Secondary | ICD-10-CM | POA: Diagnosis not present

## 2018-03-05 DIAGNOSIS — M3390 Dermatopolymyositis, unspecified, organ involvement unspecified: Secondary | ICD-10-CM | POA: Diagnosis not present

## 2018-03-05 DIAGNOSIS — R1310 Dysphagia, unspecified: Secondary | ICD-10-CM | POA: Diagnosis not present

## 2018-03-10 DIAGNOSIS — E119 Type 2 diabetes mellitus without complications: Secondary | ICD-10-CM | POA: Diagnosis not present

## 2018-03-11 DIAGNOSIS — R0609 Other forms of dyspnea: Secondary | ICD-10-CM | POA: Diagnosis not present

## 2018-03-15 DIAGNOSIS — M3390 Dermatopolymyositis, unspecified, organ involvement unspecified: Secondary | ICD-10-CM | POA: Diagnosis not present

## 2018-03-17 DIAGNOSIS — E119 Type 2 diabetes mellitus without complications: Secondary | ICD-10-CM | POA: Diagnosis not present

## 2018-03-17 DIAGNOSIS — M3310 Other dermatopolymyositis, organ involvement unspecified: Secondary | ICD-10-CM | POA: Diagnosis not present

## 2018-03-17 DIAGNOSIS — G4733 Obstructive sleep apnea (adult) (pediatric): Secondary | ICD-10-CM | POA: Diagnosis not present

## 2018-03-26 DIAGNOSIS — M3393 Dermatopolymyositis, unspecified without myopathy: Secondary | ICD-10-CM | POA: Diagnosis not present

## 2018-03-27 DIAGNOSIS — M3393 Dermatopolymyositis, unspecified without myopathy: Secondary | ICD-10-CM | POA: Diagnosis not present

## 2018-03-30 DIAGNOSIS — Z79899 Other long term (current) drug therapy: Secondary | ICD-10-CM | POA: Diagnosis not present

## 2018-03-30 DIAGNOSIS — M3390 Dermatopolymyositis, unspecified, organ involvement unspecified: Secondary | ICD-10-CM | POA: Diagnosis not present

## 2018-04-07 DIAGNOSIS — Z79899 Other long term (current) drug therapy: Secondary | ICD-10-CM | POA: Diagnosis not present

## 2018-04-07 DIAGNOSIS — M3390 Dermatopolymyositis, unspecified, organ involvement unspecified: Secondary | ICD-10-CM | POA: Diagnosis not present

## 2018-04-13 DIAGNOSIS — M3390 Dermatopolymyositis, unspecified, organ involvement unspecified: Secondary | ICD-10-CM | POA: Diagnosis not present

## 2018-04-13 DIAGNOSIS — M6089 Other myositis, multiple sites: Secondary | ICD-10-CM | POA: Diagnosis not present

## 2018-04-13 DIAGNOSIS — J849 Interstitial pulmonary disease, unspecified: Secondary | ICD-10-CM | POA: Diagnosis not present

## 2018-04-24 DIAGNOSIS — M3393 Dermatopolymyositis, unspecified without myopathy: Secondary | ICD-10-CM | POA: Diagnosis not present

## 2018-04-26 DIAGNOSIS — M3310 Other dermatopolymyositis, organ involvement unspecified: Secondary | ICD-10-CM | POA: Diagnosis not present

## 2018-04-27 DIAGNOSIS — M6089 Other myositis, multiple sites: Secondary | ICD-10-CM | POA: Diagnosis not present

## 2018-04-27 DIAGNOSIS — J849 Interstitial pulmonary disease, unspecified: Secondary | ICD-10-CM | POA: Diagnosis not present

## 2018-04-27 DIAGNOSIS — M3390 Dermatopolymyositis, unspecified, organ involvement unspecified: Secondary | ICD-10-CM | POA: Diagnosis not present

## 2018-05-14 DIAGNOSIS — Z79899 Other long term (current) drug therapy: Secondary | ICD-10-CM | POA: Diagnosis not present

## 2018-05-14 DIAGNOSIS — M3390 Dermatopolymyositis, unspecified, organ involvement unspecified: Secondary | ICD-10-CM | POA: Diagnosis not present

## 2018-05-21 DIAGNOSIS — M3310 Other dermatopolymyositis, organ involvement unspecified: Secondary | ICD-10-CM | POA: Diagnosis not present

## 2018-05-22 DIAGNOSIS — M3310 Other dermatopolymyositis, organ involvement unspecified: Secondary | ICD-10-CM | POA: Diagnosis not present

## 2018-06-02 DIAGNOSIS — G4733 Obstructive sleep apnea (adult) (pediatric): Secondary | ICD-10-CM | POA: Diagnosis not present

## 2018-06-02 DIAGNOSIS — M3390 Dermatopolymyositis, unspecified, organ involvement unspecified: Secondary | ICD-10-CM | POA: Diagnosis not present

## 2018-06-02 DIAGNOSIS — J849 Interstitial pulmonary disease, unspecified: Secondary | ICD-10-CM | POA: Diagnosis not present

## 2018-06-02 DIAGNOSIS — K219 Gastro-esophageal reflux disease without esophagitis: Secondary | ICD-10-CM | POA: Diagnosis not present

## 2018-06-02 DIAGNOSIS — B370 Candidal stomatitis: Secondary | ICD-10-CM | POA: Diagnosis not present

## 2018-06-07 DIAGNOSIS — Z0389 Encounter for observation for other suspected diseases and conditions ruled out: Secondary | ICD-10-CM | POA: Diagnosis not present

## 2018-06-07 DIAGNOSIS — S91142A Puncture wound with foreign body of left great toe without damage to nail, initial encounter: Secondary | ICD-10-CM | POA: Diagnosis not present

## 2018-06-22 DIAGNOSIS — E119 Type 2 diabetes mellitus without complications: Secondary | ICD-10-CM | POA: Diagnosis not present

## 2018-06-22 DIAGNOSIS — M3310 Other dermatopolymyositis, organ involvement unspecified: Secondary | ICD-10-CM | POA: Diagnosis not present

## 2018-06-22 DIAGNOSIS — M5136 Other intervertebral disc degeneration, lumbar region: Secondary | ICD-10-CM | POA: Diagnosis not present

## 2018-06-25 DIAGNOSIS — M3310 Other dermatopolymyositis, organ involvement unspecified: Secondary | ICD-10-CM | POA: Diagnosis not present

## 2018-06-29 DIAGNOSIS — M3310 Other dermatopolymyositis, organ involvement unspecified: Secondary | ICD-10-CM | POA: Diagnosis not present

## 2018-06-29 DIAGNOSIS — G4733 Obstructive sleep apnea (adult) (pediatric): Secondary | ICD-10-CM | POA: Diagnosis not present

## 2018-06-29 DIAGNOSIS — E119 Type 2 diabetes mellitus without complications: Secondary | ICD-10-CM | POA: Diagnosis not present

## 2018-06-30 DIAGNOSIS — M3390 Dermatopolymyositis, unspecified, organ involvement unspecified: Secondary | ICD-10-CM | POA: Diagnosis not present

## 2018-06-30 DIAGNOSIS — Z79899 Other long term (current) drug therapy: Secondary | ICD-10-CM | POA: Diagnosis not present

## 2018-07-06 DIAGNOSIS — M3390 Dermatopolymyositis, unspecified, organ involvement unspecified: Secondary | ICD-10-CM | POA: Diagnosis not present

## 2018-07-06 DIAGNOSIS — L942 Calcinosis cutis: Secondary | ICD-10-CM | POA: Diagnosis not present

## 2018-07-06 DIAGNOSIS — I80292 Phlebitis and thrombophlebitis of other deep vessels of left lower extremity: Secondary | ICD-10-CM | POA: Diagnosis not present

## 2018-07-20 DIAGNOSIS — Z79899 Other long term (current) drug therapy: Secondary | ICD-10-CM | POA: Diagnosis not present

## 2018-07-20 DIAGNOSIS — M3390 Dermatopolymyositis, unspecified, organ involvement unspecified: Secondary | ICD-10-CM | POA: Diagnosis not present

## 2018-07-28 DIAGNOSIS — Z79899 Other long term (current) drug therapy: Secondary | ICD-10-CM | POA: Diagnosis not present

## 2018-07-28 DIAGNOSIS — R5383 Other fatigue: Secondary | ICD-10-CM | POA: Diagnosis not present

## 2018-07-28 DIAGNOSIS — M3390 Dermatopolymyositis, unspecified, organ involvement unspecified: Secondary | ICD-10-CM | POA: Diagnosis not present

## 2018-07-28 DIAGNOSIS — J849 Interstitial pulmonary disease, unspecified: Secondary | ICD-10-CM | POA: Diagnosis not present

## 2018-08-16 DIAGNOSIS — Z23 Encounter for immunization: Secondary | ICD-10-CM | POA: Diagnosis not present

## 2018-08-20 DIAGNOSIS — J849 Interstitial pulmonary disease, unspecified: Secondary | ICD-10-CM | POA: Diagnosis not present

## 2018-08-20 DIAGNOSIS — I80292 Phlebitis and thrombophlebitis of other deep vessels of left lower extremity: Secondary | ICD-10-CM | POA: Diagnosis not present

## 2018-08-20 DIAGNOSIS — B370 Candidal stomatitis: Secondary | ICD-10-CM | POA: Diagnosis not present

## 2018-08-31 DIAGNOSIS — G4733 Obstructive sleep apnea (adult) (pediatric): Secondary | ICD-10-CM | POA: Diagnosis not present

## 2018-08-31 DIAGNOSIS — R0609 Other forms of dyspnea: Secondary | ICD-10-CM | POA: Diagnosis not present

## 2018-09-02 DIAGNOSIS — Z79899 Other long term (current) drug therapy: Secondary | ICD-10-CM | POA: Diagnosis not present

## 2018-09-02 DIAGNOSIS — M3310 Other dermatopolymyositis, organ involvement unspecified: Secondary | ICD-10-CM | POA: Diagnosis not present

## 2018-09-03 DIAGNOSIS — Z79899 Other long term (current) drug therapy: Secondary | ICD-10-CM | POA: Diagnosis not present

## 2018-09-03 DIAGNOSIS — M3310 Other dermatopolymyositis, organ involvement unspecified: Secondary | ICD-10-CM | POA: Diagnosis not present

## 2018-09-22 DIAGNOSIS — I80292 Phlebitis and thrombophlebitis of other deep vessels of left lower extremity: Secondary | ICD-10-CM | POA: Diagnosis not present

## 2018-09-22 DIAGNOSIS — M3390 Dermatopolymyositis, unspecified, organ involvement unspecified: Secondary | ICD-10-CM | POA: Diagnosis not present

## 2018-09-22 DIAGNOSIS — J849 Interstitial pulmonary disease, unspecified: Secondary | ICD-10-CM | POA: Diagnosis not present

## 2018-09-22 DIAGNOSIS — B370 Candidal stomatitis: Secondary | ICD-10-CM | POA: Diagnosis not present

## 2018-09-22 DIAGNOSIS — Z79899 Other long term (current) drug therapy: Secondary | ICD-10-CM | POA: Diagnosis not present

## 2018-09-28 DIAGNOSIS — R202 Paresthesia of skin: Secondary | ICD-10-CM | POA: Diagnosis not present

## 2018-09-28 DIAGNOSIS — Z125 Encounter for screening for malignant neoplasm of prostate: Secondary | ICD-10-CM | POA: Diagnosis not present

## 2018-09-28 DIAGNOSIS — E119 Type 2 diabetes mellitus without complications: Secondary | ICD-10-CM | POA: Diagnosis not present

## 2018-10-05 DIAGNOSIS — M3310 Other dermatopolymyositis, organ involvement unspecified: Secondary | ICD-10-CM | POA: Diagnosis not present

## 2018-10-05 DIAGNOSIS — M5136 Other intervertebral disc degeneration, lumbar region: Secondary | ICD-10-CM | POA: Diagnosis not present

## 2018-10-05 DIAGNOSIS — E119 Type 2 diabetes mellitus without complications: Secondary | ICD-10-CM | POA: Diagnosis not present

## 2018-10-05 DIAGNOSIS — E538 Deficiency of other specified B group vitamins: Secondary | ICD-10-CM | POA: Diagnosis not present

## 2018-10-13 DIAGNOSIS — Z79899 Other long term (current) drug therapy: Secondary | ICD-10-CM | POA: Diagnosis not present

## 2018-10-13 DIAGNOSIS — M3390 Dermatopolymyositis, unspecified, organ involvement unspecified: Secondary | ICD-10-CM | POA: Diagnosis not present

## 2018-10-13 DIAGNOSIS — D2270 Melanocytic nevi of unspecified lower limb, including hip: Secondary | ICD-10-CM | POA: Diagnosis not present

## 2018-10-14 DIAGNOSIS — Z79899 Other long term (current) drug therapy: Secondary | ICD-10-CM | POA: Diagnosis not present

## 2018-10-14 DIAGNOSIS — M3310 Other dermatopolymyositis, organ involvement unspecified: Secondary | ICD-10-CM | POA: Diagnosis not present

## 2018-10-15 DIAGNOSIS — Z79899 Other long term (current) drug therapy: Secondary | ICD-10-CM | POA: Diagnosis not present

## 2018-10-15 DIAGNOSIS — M3310 Other dermatopolymyositis, organ involvement unspecified: Secondary | ICD-10-CM | POA: Diagnosis not present

## 2018-11-25 DIAGNOSIS — Z79899 Other long term (current) drug therapy: Secondary | ICD-10-CM | POA: Diagnosis not present

## 2018-11-25 DIAGNOSIS — M3310 Other dermatopolymyositis, organ involvement unspecified: Secondary | ICD-10-CM | POA: Diagnosis not present

## 2018-11-26 DIAGNOSIS — Z79899 Other long term (current) drug therapy: Secondary | ICD-10-CM | POA: Diagnosis not present

## 2018-11-26 DIAGNOSIS — M3310 Other dermatopolymyositis, organ involvement unspecified: Secondary | ICD-10-CM | POA: Diagnosis not present

## 2018-11-30 DIAGNOSIS — G4733 Obstructive sleep apnea (adult) (pediatric): Secondary | ICD-10-CM | POA: Diagnosis not present

## 2018-12-16 DIAGNOSIS — E538 Deficiency of other specified B group vitamins: Secondary | ICD-10-CM | POA: Diagnosis not present

## 2018-12-16 DIAGNOSIS — E119 Type 2 diabetes mellitus without complications: Secondary | ICD-10-CM | POA: Diagnosis not present

## 2018-12-22 DIAGNOSIS — M3310 Other dermatopolymyositis, organ involvement unspecified: Secondary | ICD-10-CM | POA: Diagnosis not present

## 2018-12-22 DIAGNOSIS — E119 Type 2 diabetes mellitus without complications: Secondary | ICD-10-CM | POA: Diagnosis not present

## 2018-12-22 DIAGNOSIS — G4733 Obstructive sleep apnea (adult) (pediatric): Secondary | ICD-10-CM | POA: Diagnosis not present

## 2018-12-23 DIAGNOSIS — M3310 Other dermatopolymyositis, organ involvement unspecified: Secondary | ICD-10-CM | POA: Diagnosis not present

## 2018-12-23 DIAGNOSIS — Z79899 Other long term (current) drug therapy: Secondary | ICD-10-CM | POA: Diagnosis not present

## 2018-12-24 DIAGNOSIS — M3310 Other dermatopolymyositis, organ involvement unspecified: Secondary | ICD-10-CM | POA: Diagnosis not present

## 2018-12-24 DIAGNOSIS — Z79899 Other long term (current) drug therapy: Secondary | ICD-10-CM | POA: Diagnosis not present

## 2018-12-28 ENCOUNTER — Other Ambulatory Visit: Payer: Self-pay | Admitting: Internal Medicine

## 2018-12-28 DIAGNOSIS — G8929 Other chronic pain: Secondary | ICD-10-CM

## 2018-12-28 DIAGNOSIS — M5442 Lumbago with sciatica, left side: Principal | ICD-10-CM

## 2019-01-02 ENCOUNTER — Ambulatory Visit
Admission: RE | Admit: 2019-01-02 | Discharge: 2019-01-02 | Disposition: A | Discharge status: Home / Self Care | Payer: 59 | Source: Ambulatory Visit | Attending: Internal Medicine | Admitting: Internal Medicine

## 2019-01-02 DIAGNOSIS — M48061 Spinal stenosis, lumbar region without neurogenic claudication: Secondary | ICD-10-CM | POA: Diagnosis not present

## 2019-01-02 DIAGNOSIS — M5442 Lumbago with sciatica, left side: Principal | ICD-10-CM

## 2019-01-02 DIAGNOSIS — G8929 Other chronic pain: Secondary | ICD-10-CM

## 2019-01-11 DIAGNOSIS — M5416 Radiculopathy, lumbar region: Secondary | ICD-10-CM | POA: Diagnosis not present

## 2019-01-11 DIAGNOSIS — M3390 Dermatopolymyositis, unspecified, organ involvement unspecified: Secondary | ICD-10-CM | POA: Diagnosis not present

## 2019-01-11 DIAGNOSIS — Z79899 Other long term (current) drug therapy: Secondary | ICD-10-CM | POA: Diagnosis not present

## 2019-01-11 DIAGNOSIS — I80292 Phlebitis and thrombophlebitis of other deep vessels of left lower extremity: Secondary | ICD-10-CM | POA: Diagnosis not present

## 2019-01-11 DIAGNOSIS — J849 Interstitial pulmonary disease, unspecified: Secondary | ICD-10-CM | POA: Diagnosis not present

## 2019-01-11 DIAGNOSIS — B370 Candidal stomatitis: Secondary | ICD-10-CM | POA: Diagnosis not present

## 2019-01-19 DIAGNOSIS — M5416 Radiculopathy, lumbar region: Secondary | ICD-10-CM | POA: Diagnosis not present

## 2019-01-19 DIAGNOSIS — M5126 Other intervertebral disc displacement, lumbar region: Secondary | ICD-10-CM | POA: Diagnosis not present

## 2019-01-24 DIAGNOSIS — M5432 Sciatica, left side: Secondary | ICD-10-CM | POA: Diagnosis not present

## 2019-01-27 DIAGNOSIS — Z79899 Other long term (current) drug therapy: Secondary | ICD-10-CM | POA: Diagnosis not present

## 2019-01-27 DIAGNOSIS — M3310 Other dermatopolymyositis, organ involvement unspecified: Secondary | ICD-10-CM | POA: Diagnosis not present

## 2019-01-28 DIAGNOSIS — M3310 Other dermatopolymyositis, organ involvement unspecified: Secondary | ICD-10-CM | POA: Diagnosis not present

## 2019-01-28 DIAGNOSIS — Z79899 Other long term (current) drug therapy: Secondary | ICD-10-CM | POA: Diagnosis not present

## 2019-03-03 DIAGNOSIS — Z79899 Other long term (current) drug therapy: Secondary | ICD-10-CM | POA: Diagnosis not present

## 2019-03-03 DIAGNOSIS — M3310 Other dermatopolymyositis, organ involvement unspecified: Secondary | ICD-10-CM | POA: Diagnosis not present

## 2019-03-04 DIAGNOSIS — Z79899 Other long term (current) drug therapy: Secondary | ICD-10-CM | POA: Diagnosis not present

## 2019-03-04 DIAGNOSIS — M3310 Other dermatopolymyositis, organ involvement unspecified: Secondary | ICD-10-CM | POA: Diagnosis not present

## 2019-03-15 DIAGNOSIS — E119 Type 2 diabetes mellitus without complications: Secondary | ICD-10-CM | POA: Diagnosis not present

## 2019-03-15 DIAGNOSIS — M3310 Other dermatopolymyositis, organ involvement unspecified: Secondary | ICD-10-CM | POA: Diagnosis not present

## 2019-03-15 DIAGNOSIS — G4733 Obstructive sleep apnea (adult) (pediatric): Secondary | ICD-10-CM | POA: Diagnosis not present

## 2019-03-15 DIAGNOSIS — E538 Deficiency of other specified B group vitamins: Secondary | ICD-10-CM | POA: Diagnosis not present

## 2019-03-22 DIAGNOSIS — E119 Type 2 diabetes mellitus without complications: Secondary | ICD-10-CM | POA: Diagnosis not present

## 2019-03-22 DIAGNOSIS — M5136 Other intervertebral disc degeneration, lumbar region: Secondary | ICD-10-CM | POA: Diagnosis not present

## 2019-03-22 DIAGNOSIS — M3310 Other dermatopolymyositis, organ involvement unspecified: Secondary | ICD-10-CM | POA: Diagnosis not present

## 2019-10-30 IMAGING — MR MR LUMBAR SPINE W/O CM
4 of 5 series · 25 of 48 positions shown · non-contrast
Comparison: MRI lumbar spine dated April 10, 2017.

CLINICAL DATA: Chronic low back pain radiating down the left leg.

EXAM:
MRI LUMBAR SPINE WITHOUT CONTRAST
TECHNIQUE: Multiplanar, multisequence MR imaging of the lumbar spine was
performed. No intravenous contrast was administered.

[Series 2: T2 · sagittal · 4.0mm · 0.55mm/px · 6 of 16 slices shown (1 of 2)]
[im 1/16]
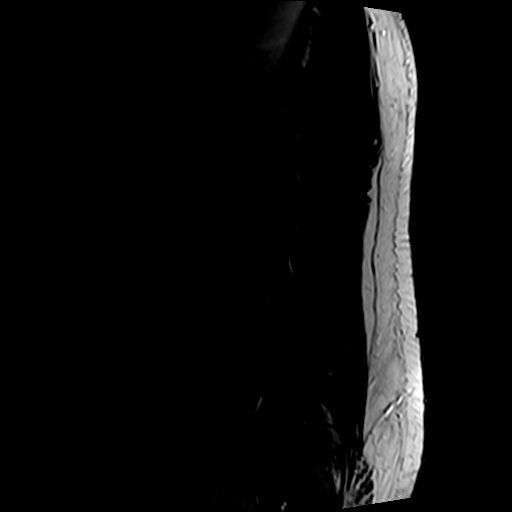
[im 4/16]
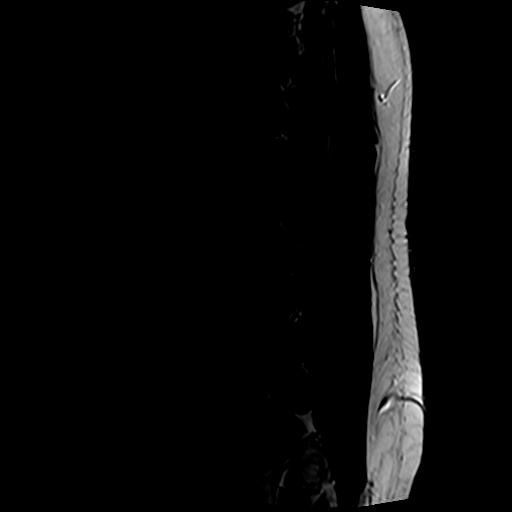
[im 7/16]
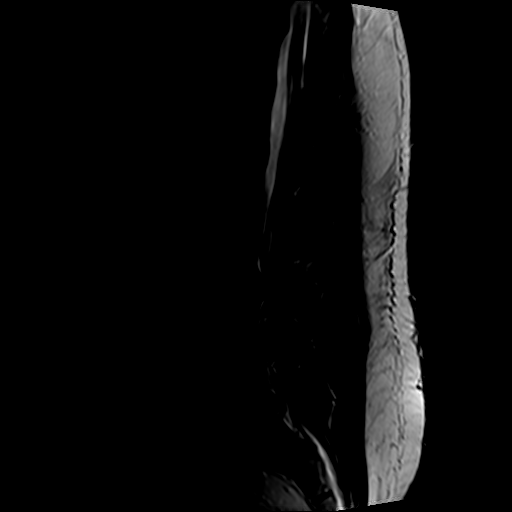
[im 10/16]
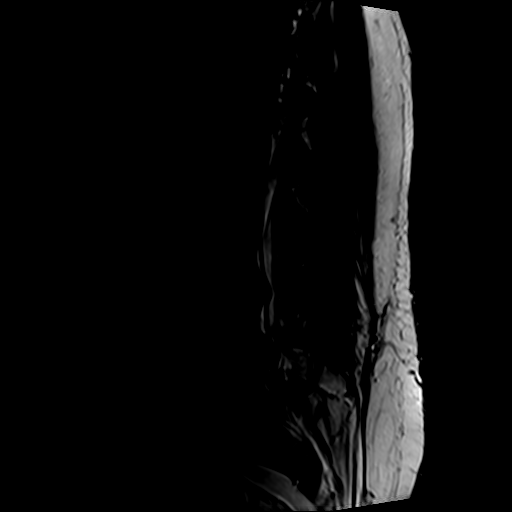
[im 13/16]
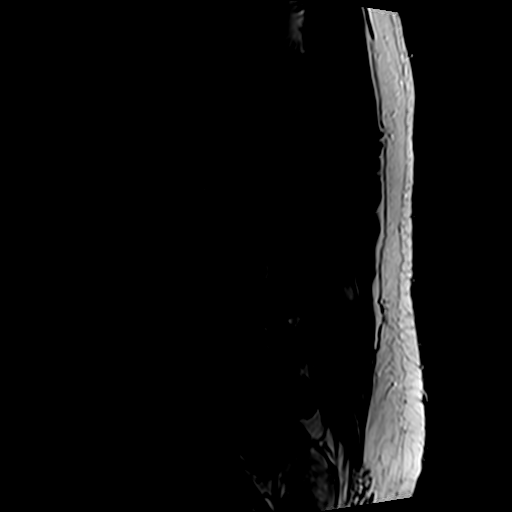
[im 16/16]
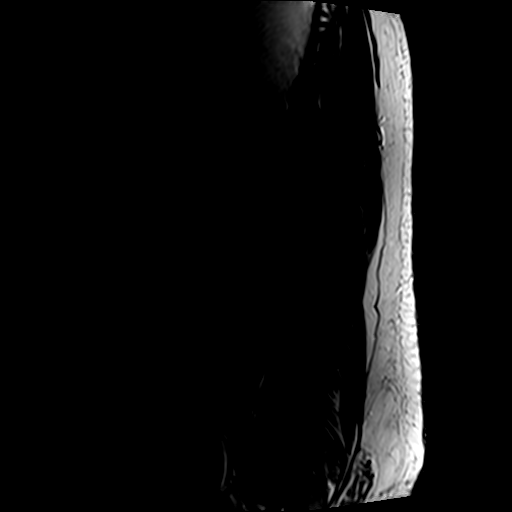

[Series 4: T1 · sagittal · 4.0mm · 0.55mm/px · 6 of 16 slices shown (1 of 2)]
[im 1/16]
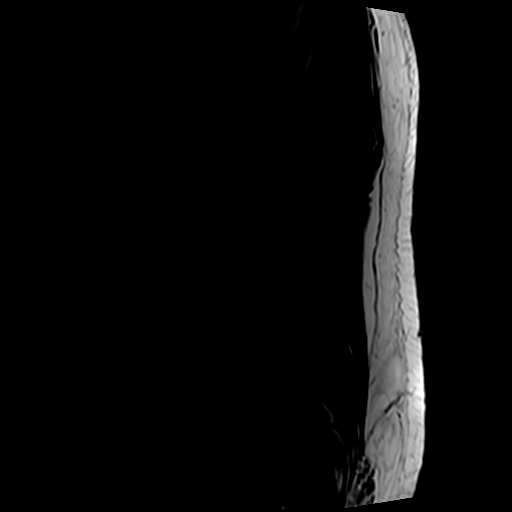
[im 4/16]
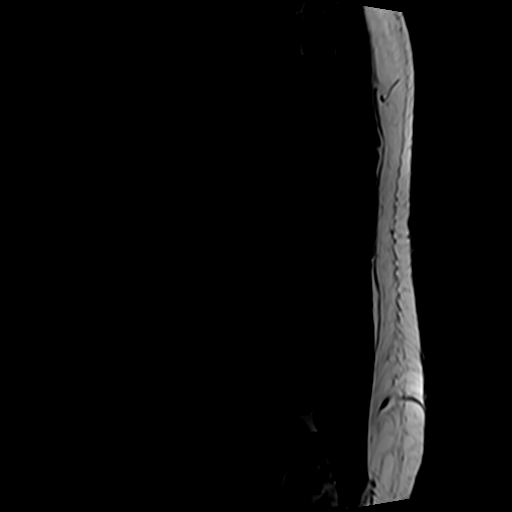
[im 7/16]
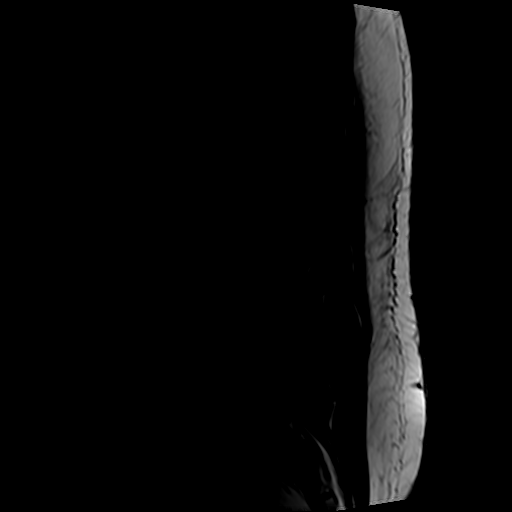
[im 10/16]
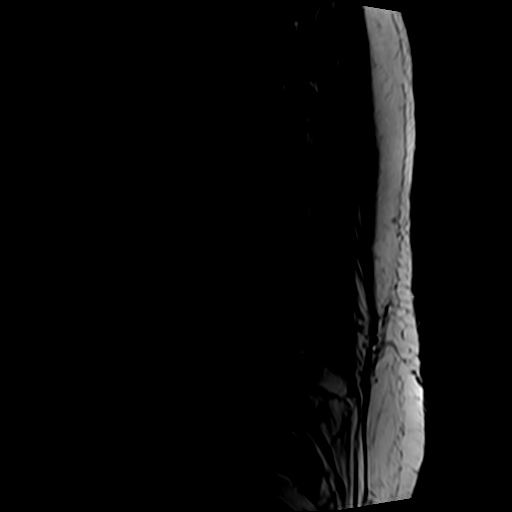
[im 13/16]
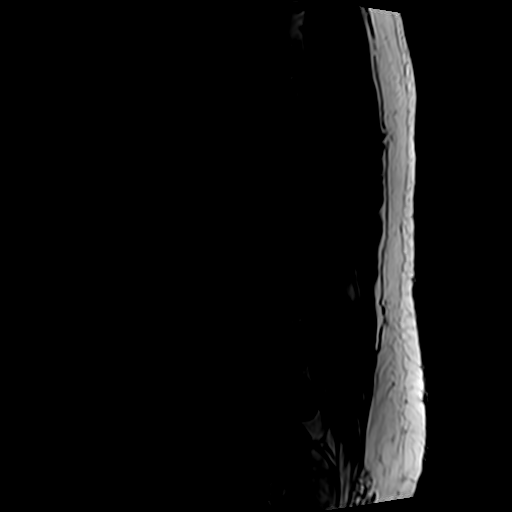
[im 16/16]
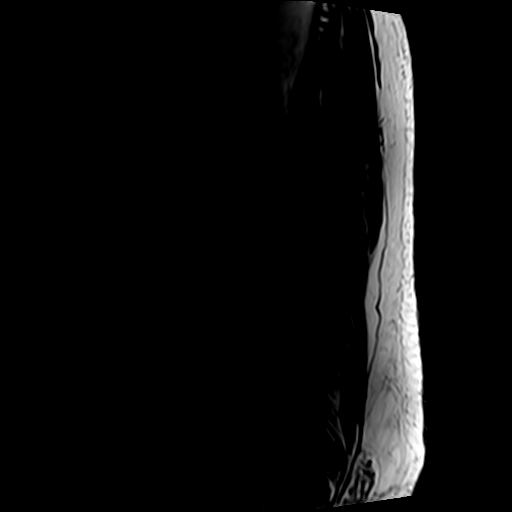

[Series 5: T2 · axial · 4.0mm · 0.70mm/px · z∈[-125,+97]mm · 9 of 39 slices shown (2 of 2)]
[im 1/39]
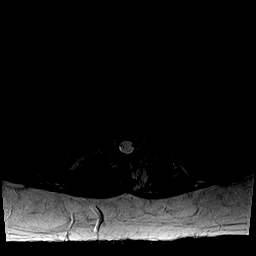
[im 6/39]
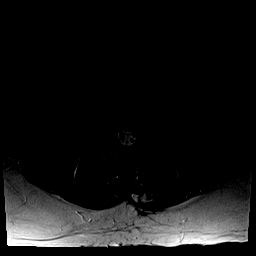
[im 11/39]
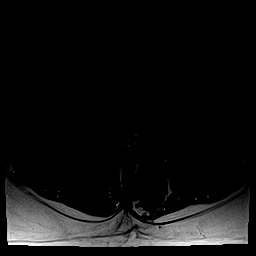
[im 17/39]
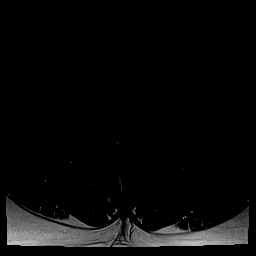
[im 20/39]
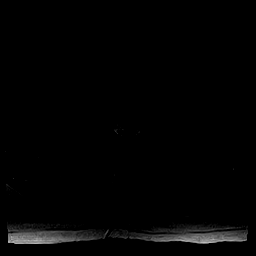
[im 22/39]
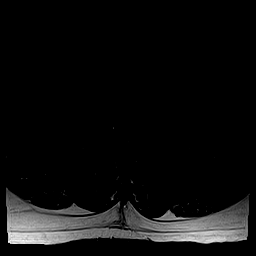
[im 28/39]
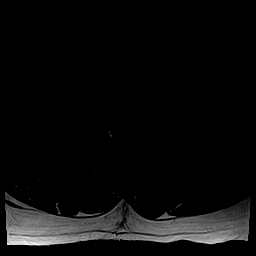
[im 33/39]
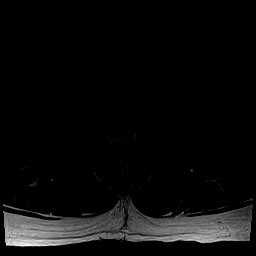
[im 39/39]
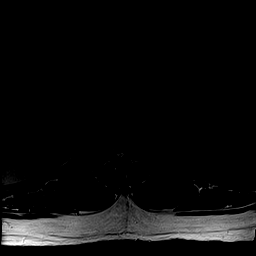

[Series 6: T1 · axial · 4.0mm · 0.35mm/px · z∈[-125,+66]mm · 4 of 39 slices shown (2 of 2)]
[im 1/39]
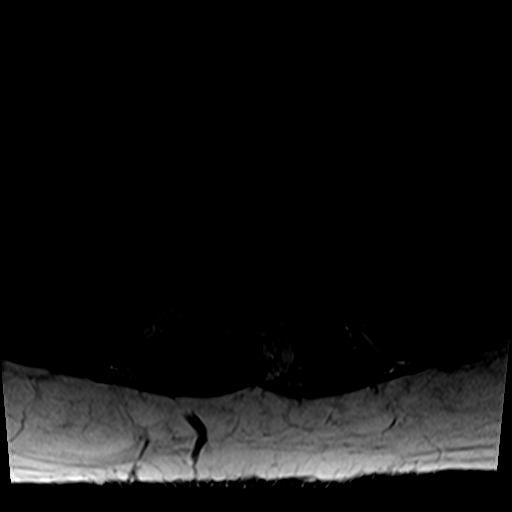
[im 6/39]
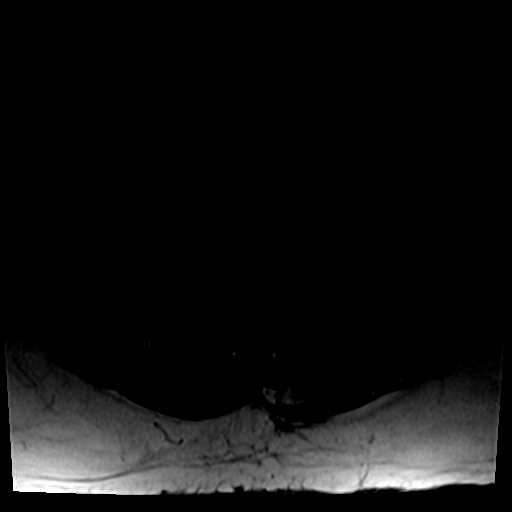
[im 20/39]
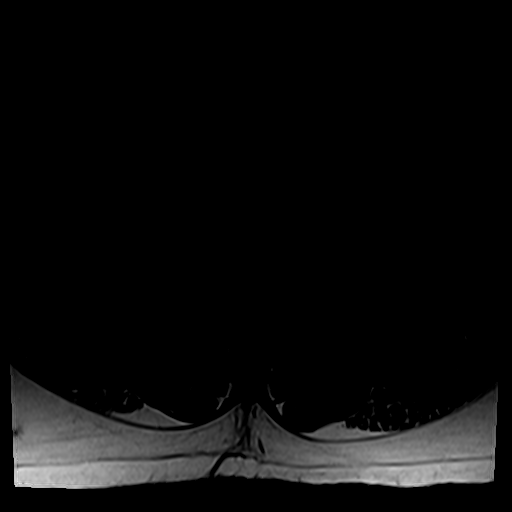
[im 33/39]
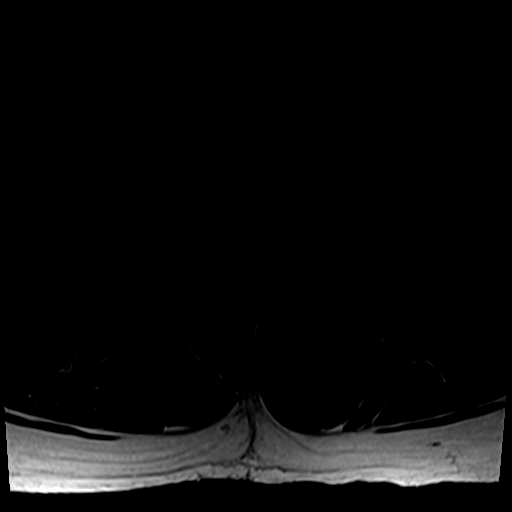

[25 of 48 positions shown; findings below may reference images not displayed]

FINDINGS: Segmentation:  Standard.

Alignment: Straightening of the normal lumbar lordosis. No
listhesis.

Vertebrae:  No fracture, evidence of discitis, or bone lesion.

Conus medullaris and cauda equina: Conus extends to the T12-L1
level. Conus and cauda equina appear normal.

Paraspinal and other soft tissues: Negative.

Disc levels:

T11-T12: Only seen on the sagittal images. Small central disc
protrusion. No stenosis.

T12-L1:  Negative.

L1-L2:  Negative.

L2-L3:  Negative.

L3-L4: Broad-based posterior disc protrusion has slightly
progressed. New mild spinal canal stenosis. No neuroforaminal
stenosis.

L4-L5: Unchanged large left paracentral disc extrusion with inferior
migration. Prior left hemilaminectomy. Unchanged moderate to severe
central spinal canal stenosis, severe left and moderate right
lateral recess stenosis, and moderate bilateral neuroforaminal
stenosis.

L5-S1: Broad-based posterior disc protrusion eccentric to the left
has slightly progressed. Unchanged mild bilateral facet arthropathy.
Progressive now severe left neuroforaminal stenosis. Unchanged mild
left lateral recess stenosis. New mild right neuroforaminal
stenosis.
IMPRESSION: 1. Progressive disc protrusion at L5-S1 with now severe left
neuroforaminal stenosis.
2. Unchanged large left paracentral disc extrusion at L4-L5 with
moderate to severe central spinal canal stenosis, severe left and
moderate right lateral recess stenosis, and moderate bilateral
neuroforaminal stenosis.
3. Progressive disc protrusion at L3-L4 with new mild spinal canal
stenosis.

## 2021-12-06 ENCOUNTER — Other Ambulatory Visit: Payer: Self-pay | Admitting: Internal Medicine

## 2021-12-06 DIAGNOSIS — R2232 Localized swelling, mass and lump, left upper limb: Secondary | ICD-10-CM

## 2021-12-12 ENCOUNTER — Ambulatory Visit
Admission: RE | Admit: 2021-12-12 | Discharge: 2021-12-12 | Disposition: A | Discharge status: Home / Self Care | Payer: 59 | Source: Ambulatory Visit | Attending: Internal Medicine | Admitting: Internal Medicine

## 2021-12-12 DIAGNOSIS — R2232 Localized swelling, mass and lump, left upper limb: Secondary | ICD-10-CM | POA: Insufficient documentation

## 2021-12-18 ENCOUNTER — Other Ambulatory Visit: Payer: Self-pay | Admitting: Internal Medicine

## 2021-12-18 DIAGNOSIS — R2232 Localized swelling, mass and lump, left upper limb: Secondary | ICD-10-CM

## 2022-01-02 ENCOUNTER — Ambulatory Visit
Admission: RE | Admit: 2022-01-02 | Discharge: 2022-01-02 | Disposition: A | Discharge status: Home / Self Care | Payer: 59 | Source: Ambulatory Visit | Attending: Internal Medicine | Admitting: Internal Medicine

## 2022-01-02 ENCOUNTER — Other Ambulatory Visit: Payer: Self-pay

## 2022-01-02 DIAGNOSIS — R2232 Localized swelling, mass and lump, left upper limb: Secondary | ICD-10-CM | POA: Diagnosis present

## 2022-01-10 ENCOUNTER — Other Ambulatory Visit: Payer: 59
